# Patient Record
Sex: Female | Born: 1965 | Race: White | Hispanic: No | Marital: Married | Smoking: Current every day smoker
Health system: Southern US, Community
[De-identification: ages and names within clinical notes are randomized; demographics above are authoritative.]

## PROBLEM LIST (undated history)

## (undated) DIAGNOSIS — Z860100 Personal history of colon polyps, unspecified: Secondary | ICD-10-CM

## (undated) DIAGNOSIS — J449 Chronic obstructive pulmonary disease, unspecified: Secondary | ICD-10-CM

## (undated) DIAGNOSIS — F32A Depression, unspecified: Secondary | ICD-10-CM

## (undated) DIAGNOSIS — F329 Major depressive disorder, single episode, unspecified: Secondary | ICD-10-CM

## (undated) DIAGNOSIS — K5090 Crohn's disease, unspecified, without complications: Secondary | ICD-10-CM

## (undated) DIAGNOSIS — J45909 Unspecified asthma, uncomplicated: Secondary | ICD-10-CM

## (undated) HISTORY — PX: CHOLECYSTECTOMY: SHX55

## (undated) HISTORY — PX: EYE SURGERY: SHX253

## (undated) HISTORY — PX: ABDOMINAL HYSTERECTOMY: SHX81

---

## 2005-01-10 ENCOUNTER — Ambulatory Visit: Payer: Self-pay

## 2005-01-17 ENCOUNTER — Ambulatory Visit: Payer: Self-pay

## 2005-01-24 ENCOUNTER — Ambulatory Visit: Payer: Self-pay

## 2005-02-06 ENCOUNTER — Ambulatory Visit: Payer: Self-pay

## 2005-03-07 ENCOUNTER — Ambulatory Visit: Payer: Self-pay

## 2006-04-06 ENCOUNTER — Emergency Department: Payer: Self-pay | Admitting: Internal Medicine

## 2006-11-26 ENCOUNTER — Emergency Department: Payer: Self-pay

## 2006-11-27 ENCOUNTER — Emergency Department: Payer: Self-pay | Admitting: Emergency Medicine

## 2007-10-23 ENCOUNTER — Emergency Department: Payer: Self-pay | Admitting: Internal Medicine

## 2008-02-17 ENCOUNTER — Emergency Department: Payer: Self-pay | Admitting: Emergency Medicine

## 2008-02-17 ENCOUNTER — Other Ambulatory Visit: Payer: Self-pay

## 2008-10-11 ENCOUNTER — Emergency Department: Payer: Self-pay | Admitting: Unknown Physician Specialty

## 2008-10-25 ENCOUNTER — Emergency Department: Payer: Self-pay | Admitting: Emergency Medicine

## 2009-07-13 ENCOUNTER — Ambulatory Visit: Payer: Self-pay | Admitting: Gastroenterology

## 2009-10-27 ENCOUNTER — Ambulatory Visit: Payer: Self-pay | Admitting: Gastroenterology

## 2009-11-23 ENCOUNTER — Ambulatory Visit: Payer: Self-pay | Admitting: Family Medicine

## 2010-03-06 ENCOUNTER — Ambulatory Visit: Payer: Self-pay | Admitting: Gastroenterology

## 2011-07-09 ENCOUNTER — Emergency Department: Payer: Self-pay | Admitting: Emergency Medicine

## 2011-08-03 ENCOUNTER — Ambulatory Visit: Payer: Self-pay | Admitting: Emergency Medicine

## 2011-08-09 ENCOUNTER — Ambulatory Visit: Payer: Self-pay | Admitting: Emergency Medicine

## 2011-08-13 LAB — PATHOLOGY REPORT

## 2012-02-27 ENCOUNTER — Ambulatory Visit: Payer: Self-pay | Admitting: Family Medicine

## 2012-03-04 ENCOUNTER — Emergency Department: Payer: Self-pay | Admitting: Emergency Medicine

## 2012-03-04 LAB — COMPREHENSIVE METABOLIC PANEL
Albumin: 3.2 g/dL — ABNORMAL LOW (ref 3.4–5.0)
Anion Gap: 6 — ABNORMAL LOW (ref 7–16)
BUN: 8 mg/dL (ref 7–18)
Bilirubin,Total: 0.3 mg/dL (ref 0.2–1.0)
Calcium, Total: 8.8 mg/dL (ref 8.5–10.1)
Chloride: 106 mmol/L (ref 98–107)
Co2: 26 mmol/L (ref 21–32)
Glucose: 100 mg/dL — ABNORMAL HIGH (ref 65–99)
SGOT(AST): 16 U/L (ref 15–37)
SGPT (ALT): 14 U/L
Sodium: 138 mmol/L (ref 136–145)
Total Protein: 7.3 g/dL (ref 6.4–8.2)

## 2012-03-04 LAB — URINALYSIS, COMPLETE
Glucose,UR: NEGATIVE mg/dL (ref 0–75)
Ketone: NEGATIVE
Ph: 5 (ref 4.5–8.0)
Protein: 30

## 2012-03-04 LAB — CBC
HCT: 40.3 % (ref 35.0–47.0)
MCV: 77 fL — ABNORMAL LOW (ref 80–100)
RDW: 15.5 % — ABNORMAL HIGH (ref 11.5–14.5)

## 2012-05-15 ENCOUNTER — Ambulatory Visit: Payer: Self-pay | Admitting: Obstetrics and Gynecology

## 2012-05-15 LAB — CBC
HGB: 11.5 g/dL — ABNORMAL LOW (ref 12.0–16.0)
MCHC: 31.5 g/dL — ABNORMAL LOW (ref 32.0–36.0)
MCV: 76 fL — ABNORMAL LOW (ref 80–100)
RBC: 4.81 10*6/uL (ref 3.80–5.20)
RDW: 16.7 % — ABNORMAL HIGH (ref 11.5–14.5)
WBC: 9.3 10*3/uL (ref 3.6–11.0)

## 2012-05-20 ENCOUNTER — Ambulatory Visit: Payer: Self-pay | Admitting: Obstetrics and Gynecology

## 2012-05-21 LAB — HEMOGLOBIN: HGB: 8.7 g/dL — ABNORMAL LOW (ref 12.0–16.0)

## 2012-05-26 LAB — PATHOLOGY REPORT

## 2013-08-16 ENCOUNTER — Inpatient Hospital Stay: Payer: Self-pay | Admitting: Student

## 2013-08-16 LAB — COMPREHENSIVE METABOLIC PANEL
Alkaline Phosphatase: 115 U/L (ref 50–136)
Anion Gap: 5 — ABNORMAL LOW (ref 7–16)
BUN: 8 mg/dL (ref 7–18)
Co2: 25 mmol/L (ref 21–32)
Creatinine: 0.76 mg/dL (ref 0.60–1.30)
EGFR (African American): >60
EGFR (Non-African Amer.): >60
Glucose: 104 mg/dL — ABNORMAL HIGH (ref 65–99)
Osmolality: 272 (ref 275–301)
Potassium: 4.1 mmol/L (ref 3.5–5.1)
SGPT (ALT): 18 U/L (ref 12–78)
Sodium: 137 mmol/L (ref 136–145)

## 2013-08-16 LAB — URINALYSIS, COMPLETE
Glucose,UR: NEGATIVE mg/dL (ref 0–75)
Hyaline Cast: 5
Nitrite: NEGATIVE
Squamous Epithelial: 8
WBC UR: 2 /HPF (ref 0–5)

## 2013-08-16 LAB — SEDIMENTATION RATE: Erythrocyte Sed Rate: 2 mm/hr (ref 0–20)

## 2013-08-16 LAB — CBC
MCHC: 33.7 g/dL (ref 32.0–36.0)
MCV: 88 fL (ref 80–100)
WBC: 17.4 10*3/uL — ABNORMAL HIGH (ref 3.6–11.0)

## 2013-08-16 LAB — LIPASE, BLOOD: Lipase: 95 U/L (ref 73–393)

## 2013-08-17 LAB — CBC WITH DIFFERENTIAL/PLATELET
HCT: 40.4 % (ref 35.0–47.0)
HGB: 13.6 g/dL (ref 12.0–16.0)
Lymphocyte #: 0.8 10*3/uL — ABNORMAL LOW (ref 1.0–3.6)
Lymphocyte %: 6.1 %
Monocyte #: 0.2 x10 3/mm (ref 0.2–0.9)
Monocyte %: 1.6 %
Neutrophil #: 11.7 10*3/uL — ABNORMAL HIGH (ref 1.4–6.5)
Neutrophil %: 92.2 %
RBC: 4.64 10*6/uL (ref 3.80–5.20)
WBC: 12.7 10*3/uL — ABNORMAL HIGH (ref 3.6–11.0)

## 2013-08-17 LAB — BASIC METABOLIC PANEL
BUN: 7 mg/dL (ref 7–18)
Chloride: 107 mmol/L (ref 98–107)
Creatinine: 0.61 mg/dL (ref 0.60–1.30)
EGFR (African American): >60
EGFR (Non-African Amer.): >60
Glucose: 124 mg/dL — ABNORMAL HIGH (ref 65–99)
Osmolality: 277 (ref 275–301)
Potassium: 3.8 mmol/L (ref 3.5–5.1)
Sodium: 139 mmol/L (ref 136–145)

## 2013-08-18 LAB — CBC WITH DIFFERENTIAL/PLATELET
Eosinophil #: 0 10*3/uL (ref 0.0–0.7)
Eosinophil %: 0 %
HCT: 38.1 % (ref 35.0–47.0)
HGB: 12.9 g/dL (ref 12.0–16.0)
Lymphocyte %: 6.2 %
MCH: 29.7 pg (ref 26.0–34.0)
MCHC: 33.7 g/dL (ref 32.0–36.0)
Monocyte #: 0.5 x10 3/mm (ref 0.2–0.9)
Neutrophil #: 12.6 10*3/uL — ABNORMAL HIGH (ref 1.4–6.5)
RBC: 4.33 10*6/uL (ref 3.80–5.20)
RDW: 13.3 % (ref 11.5–14.5)
WBC: 14 10*3/uL — ABNORMAL HIGH (ref 3.6–11.0)

## 2013-12-14 ENCOUNTER — Ambulatory Visit: Payer: Self-pay | Admitting: Gastroenterology

## 2014-01-06 ENCOUNTER — Other Ambulatory Visit: Payer: Self-pay | Admitting: Urgent Care

## 2014-01-06 LAB — COMPREHENSIVE METABOLIC PANEL
ALT: 45 U/L (ref 12–78)
AST: 32 U/L (ref 15–37)
Albumin: 3.3 g/dL — ABNORMAL LOW (ref 3.4–5.0)
Alkaline Phosphatase: 115 U/L
Anion Gap: 3 — ABNORMAL LOW (ref 7–16)
BUN: 8 mg/dL (ref 7–18)
Bilirubin,Total: 0.3 mg/dL (ref 0.2–1.0)
CALCIUM: 9.2 mg/dL (ref 8.5–10.1)
Chloride: 105 mmol/L (ref 98–107)
Co2: 29 mmol/L (ref 21–32)
Creatinine: 0.62 mg/dL (ref 0.60–1.30)
EGFR (African American): >60
EGFR (Non-African Amer.): >60
GLUCOSE: 74 mg/dL (ref 65–99)
Osmolality: 271 (ref 275–301)
Potassium: 3.9 mmol/L (ref 3.5–5.1)
SODIUM: 137 mmol/L (ref 136–145)
Total Protein: 7.2 g/dL (ref 6.4–8.2)

## 2014-01-06 LAB — CBC WITH DIFFERENTIAL/PLATELET
BASOS ABS: 0.1 10*3/uL (ref 0.0–0.1)
BASOS PCT: 0.7 %
Eosinophil #: 0.3 10*3/uL (ref 0.0–0.7)
Eosinophil %: 3.3 %
HCT: 45.9 % (ref 35.0–47.0)
HGB: 15.6 g/dL (ref 12.0–16.0)
LYMPHS ABS: 2 10*3/uL (ref 1.0–3.6)
Lymphocyte %: 19.4 %
MCH: 30.4 pg (ref 26.0–34.0)
MCHC: 34 g/dL (ref 32.0–36.0)
MCV: 90 fL (ref 80–100)
MONO ABS: 0.9 x10 3/mm (ref 0.2–0.9)
Monocyte %: 8.6 %
Neutrophil #: 7 10*3/uL — ABNORMAL HIGH (ref 1.4–6.5)
Neutrophil %: 68 %
Platelet: 304 10*3/uL (ref 150–440)
RBC: 5.13 10*6/uL (ref 3.80–5.20)
RDW: 13.7 % (ref 11.5–14.5)
WBC: 10.2 10*3/uL (ref 3.6–11.0)

## 2014-03-30 ENCOUNTER — Other Ambulatory Visit: Payer: Self-pay | Admitting: Urgent Care

## 2014-03-30 LAB — URINALYSIS, COMPLETE
Bacteria: NONE SEEN
Glucose,UR: NEGATIVE mg/dL (ref 0–75)
Leukocyte Esterase: NEGATIVE
NITRITE: NEGATIVE
PH: 5 (ref 4.5–8.0)
RBC,UR: <1 /HPF (ref 0–5)
Specific Gravity: 1.044 (ref 1.003–1.030)
WBC UR: <1 /HPF (ref 0–5)

## 2014-03-30 LAB — CBC WITH DIFFERENTIAL/PLATELET
BASOS PCT: 0.8 %
Basophil #: 0.1 10*3/uL (ref 0.0–0.1)
Eosinophil #: 0.1 10*3/uL (ref 0.0–0.7)
Eosinophil %: 0.9 %
HCT: 50.2 % — ABNORMAL HIGH (ref 35.0–47.0)
HGB: 16.4 g/dL — AB (ref 12.0–16.0)
Lymphocyte #: 2.8 10*3/uL (ref 1.0–3.6)
Lymphocyte %: 19.4 %
MCH: 29.2 pg (ref 26.0–34.0)
MCHC: 32.6 g/dL (ref 32.0–36.0)
MCV: 90 fL (ref 80–100)
Monocyte #: 1.2 x10 3/mm — ABNORMAL HIGH (ref 0.2–0.9)
Monocyte %: 8.4 %
NEUTROS ABS: 10.3 10*3/uL — AB (ref 1.4–6.5)
Neutrophil %: 70.5 %
PLATELETS: 292 10*3/uL (ref 150–440)
RBC: 5.61 10*6/uL — ABNORMAL HIGH (ref 3.80–5.20)
RDW: 13.6 % (ref 11.5–14.5)
WBC: 14.6 10*3/uL — AB (ref 3.6–11.0)

## 2014-03-30 LAB — COMPREHENSIVE METABOLIC PANEL
ALK PHOS: 88 U/L
ALT: 23 U/L (ref 12–78)
Albumin: 3.7 g/dL (ref 3.4–5.0)
Anion Gap: 6 — ABNORMAL LOW (ref 7–16)
BILIRUBIN TOTAL: 0.4 mg/dL (ref 0.2–1.0)
BUN: 13 mg/dL (ref 7–18)
CHLORIDE: 106 mmol/L (ref 98–107)
CO2: 25 mmol/L (ref 21–32)
Calcium, Total: 9.1 mg/dL (ref 8.5–10.1)
Creatinine: 0.93 mg/dL (ref 0.60–1.30)
GLUCOSE: 111 mg/dL — AB (ref 65–99)
OSMOLALITY: 275 (ref 275–301)
POTASSIUM: 3.9 mmol/L (ref 3.5–5.1)
SGOT(AST): 26 U/L (ref 15–37)
Sodium: 137 mmol/L (ref 136–145)
Total Protein: 7.3 g/dL (ref 6.4–8.2)

## 2014-03-30 LAB — SEDIMENTATION RATE: Erythrocyte Sed Rate: 45 mm/hr — ABNORMAL HIGH (ref 0–20)

## 2014-05-11 ENCOUNTER — Other Ambulatory Visit: Payer: Self-pay | Admitting: Urgent Care

## 2014-05-11 LAB — CBC WITH DIFFERENTIAL/PLATELET
BASOS ABS: 0.1 10*3/uL (ref 0.0–0.1)
Basophil %: 0.8 %
EOS PCT: 2.2 %
Eosinophil #: 0.3 10*3/uL (ref 0.0–0.7)
HCT: 45.7 % (ref 35.0–47.0)
HGB: 14.9 g/dL (ref 12.0–16.0)
LYMPHS ABS: 2.1 10*3/uL (ref 1.0–3.6)
Lymphocyte %: 15.7 %
MCH: 29.4 pg (ref 26.0–34.0)
MCHC: 32.6 g/dL (ref 32.0–36.0)
MCV: 90 fL (ref 80–100)
Monocyte #: 1.3 x10 3/mm — ABNORMAL HIGH (ref 0.2–0.9)
Monocyte %: 9.3 %
Neutrophil #: 9.7 10*3/uL — ABNORMAL HIGH (ref 1.4–6.5)
Neutrophil %: 72 %
PLATELETS: 312 10*3/uL (ref 150–440)
RBC: 5.07 10*6/uL (ref 3.80–5.20)
RDW: 13.9 % (ref 11.5–14.5)
WBC: 13.5 10*3/uL — AB (ref 3.6–11.0)

## 2014-08-30 ENCOUNTER — Ambulatory Visit: Payer: Self-pay | Admitting: Urgent Care

## 2014-08-30 LAB — CBC WITH DIFFERENTIAL/PLATELET
Basophil #: 0.1 10*3/uL (ref 0.0–0.1)
Basophil %: 0.5 %
EOS PCT: 3.3 %
Eosinophil #: 0.4 10*3/uL (ref 0.0–0.7)
HCT: 42.6 % (ref 35.0–47.0)
HGB: 13.7 g/dL (ref 12.0–16.0)
LYMPHS PCT: 28.1 %
Lymphocyte #: 3 10*3/uL (ref 1.0–3.6)
MCH: 29.2 pg (ref 26.0–34.0)
MCHC: 32.2 g/dL (ref 32.0–36.0)
MCV: 91 fL (ref 80–100)
Monocyte #: 0.7 x10 3/mm (ref 0.2–0.9)
Monocyte %: 6.6 %
Neutrophil #: 6.6 10*3/uL — ABNORMAL HIGH (ref 1.4–6.5)
Neutrophil %: 61.5 %
Platelet: 287 10*3/uL (ref 150–440)
RBC: 4.68 10*6/uL (ref 3.80–5.20)
RDW: 13 % (ref 11.5–14.5)
WBC: 10.7 10*3/uL (ref 3.6–11.0)

## 2014-08-30 LAB — COMPREHENSIVE METABOLIC PANEL
ALBUMIN: 3.5 g/dL (ref 3.4–5.0)
ALK PHOS: 81 U/L
AST: 18 U/L (ref 15–37)
Anion Gap: 4 — ABNORMAL LOW (ref 7–16)
BUN: 8 mg/dL (ref 7–18)
Bilirubin,Total: 0.2 mg/dL (ref 0.2–1.0)
CALCIUM: 8.8 mg/dL (ref 8.5–10.1)
CHLORIDE: 108 mmol/L — AB (ref 98–107)
Co2: 31 mmol/L (ref 21–32)
Creatinine: 0.72 mg/dL (ref 0.60–1.30)
EGFR (African American): >60
EGFR (Non-African Amer.): >60
Glucose: 105 mg/dL — ABNORMAL HIGH (ref 65–99)
Osmolality: 284 (ref 275–301)
Potassium: 3.6 mmol/L (ref 3.5–5.1)
SGPT (ALT): 24 U/L
Sodium: 143 mmol/L (ref 136–145)
Total Protein: 6.5 g/dL (ref 6.4–8.2)

## 2015-02-09 ENCOUNTER — Emergency Department: Admit: 2015-02-09 | Discharge status: Against Medical Advice | Payer: Self-pay | Admitting: Student

## 2015-02-09 LAB — CBC WITH DIFFERENTIAL/PLATELET
BASOS PCT: 0.5 %
Basophil #: 0.1 10*3/uL (ref 0.0–0.1)
EOS PCT: 1 %
Eosinophil #: 0.2 10*3/uL (ref 0.0–0.7)
HCT: 52.6 % — ABNORMAL HIGH (ref 35.0–47.0)
HGB: 17.4 g/dL — AB (ref 12.0–16.0)
Lymphocyte #: 2.3 10*3/uL (ref 1.0–3.6)
Lymphocyte %: 11.5 %
MCH: 29.6 pg (ref 26.0–34.0)
MCHC: 33.1 g/dL (ref 32.0–36.0)
MCV: 90 fL (ref 80–100)
Monocyte #: 0.9 x10 3/mm (ref 0.2–0.9)
Monocyte %: 4.6 %
NEUTROS PCT: 82.4 %
Neutrophil #: 16.5 10*3/uL — ABNORMAL HIGH (ref 1.4–6.5)
PLATELETS: 334 10*3/uL (ref 150–440)
RBC: 5.88 10*6/uL — AB (ref 3.80–5.20)
RDW: 13.4 % (ref 11.5–14.5)
WBC: 20 10*3/uL — ABNORMAL HIGH (ref 3.6–11.0)

## 2015-02-09 LAB — COMPREHENSIVE METABOLIC PANEL
Albumin: 4.6 g/dL
Alkaline Phosphatase: 92 U/L
Anion Gap: 12 (ref 7–16)
BUN: 14 mg/dL
Bilirubin,Total: 0.3 mg/dL
CALCIUM: 10.2 mg/dL
Chloride: 103 mmol/L
Co2: 23 mmol/L
Creatinine: 0.75 mg/dL
Glucose: 150 mg/dL — ABNORMAL HIGH
Potassium: 4 mmol/L
SGOT(AST): 18 U/L
SGPT (ALT): 17 U/L
Sodium: 138 mmol/L
TOTAL PROTEIN: 8.2 g/dL — AB

## 2015-02-09 LAB — TROPONIN I: Troponin-I: <0.03 ng/mL

## 2015-02-09 LAB — LIPASE, BLOOD: LIPASE: 56 U/L — AB

## 2015-02-16 ENCOUNTER — Inpatient Hospital Stay
Admit: 2015-02-16 | Disposition: A | Discharge status: Home / Self Care | Payer: Self-pay | Attending: Internal Medicine | Admitting: Internal Medicine

## 2015-02-16 LAB — URINALYSIS, COMPLETE
Bilirubin,UR: NEGATIVE
Glucose,UR: NEGATIVE mg/dL (ref 0–75)
KETONE: NEGATIVE
Leukocyte Esterase: NEGATIVE
Nitrite: NEGATIVE
PH: 6 (ref 4.5–8.0)
Protein: 30
SPECIFIC GRAVITY: 1.008 (ref 1.003–1.030)

## 2015-02-16 LAB — CBC WITH DIFFERENTIAL/PLATELET
BASOS ABS: 0.1 10*3/uL (ref 0.0–0.1)
Basophil %: 0.6 %
EOS ABS: 0.1 10*3/uL (ref 0.0–0.7)
Eosinophil %: 0.7 %
HCT: 57.2 % — ABNORMAL HIGH (ref 35.0–47.0)
HGB: 18.7 g/dL — AB (ref 12.0–16.0)
Lymphocyte #: 2.7 10*3/uL (ref 1.0–3.6)
Lymphocyte %: 15.9 %
MCH: 29.3 pg (ref 26.0–34.0)
MCHC: 32.7 g/dL (ref 32.0–36.0)
MCV: 90 fL (ref 80–100)
Monocyte #: 0.9 x10 3/mm (ref 0.2–0.9)
Monocyte %: 5.5 %
NEUTROS PCT: 77.3 %
Neutrophil #: 13.1 10*3/uL — ABNORMAL HIGH (ref 1.4–6.5)
Platelet: 367 10*3/uL (ref 150–440)
RBC: 6.38 10*6/uL — ABNORMAL HIGH (ref 3.80–5.20)
RDW: 13.5 % (ref 11.5–14.5)
WBC: 16.9 10*3/uL — ABNORMAL HIGH (ref 3.6–11.0)

## 2015-02-16 LAB — COMPREHENSIVE METABOLIC PANEL
ALBUMIN: 4.6 g/dL
ALK PHOS: 92 U/L
Anion Gap: 9 (ref 7–16)
BUN: 13 mg/dL
Bilirubin,Total: 0.4 mg/dL
CALCIUM: 10.3 mg/dL
CHLORIDE: 104 mmol/L
Co2: 26 mmol/L
Creatinine: 0.75 mg/dL
EGFR (Non-African Amer.): >60
GLUCOSE: 131 mg/dL — AB
Potassium: 4.1 mmol/L
SGOT(AST): 20 U/L
SGPT (ALT): 16 U/L
Sodium: 139 mmol/L
Total Protein: 8.2 g/dL — ABNORMAL HIGH

## 2015-02-16 LAB — TROPONIN I: Troponin-I: <0.03 ng/mL

## 2015-02-16 LAB — LIPASE, BLOOD: Lipase: 63 U/L — ABNORMAL HIGH

## 2015-02-16 LAB — LACTIC ACID, PLASMA: Lactic Acid, Venous: 0.8 mmol/L

## 2015-02-17 LAB — BASIC METABOLIC PANEL
Anion Gap: 5 — ABNORMAL LOW (ref 7–16)
BUN: 11 mg/dL
CHLORIDE: 109 mmol/L
CO2: 26 mmol/L
Calcium, Total: 8.2 mg/dL — ABNORMAL LOW
Creatinine: 0.54 mg/dL
EGFR (African American): >60
Glucose: 122 mg/dL — ABNORMAL HIGH
POTASSIUM: 4 mmol/L
Sodium: 140 mmol/L

## 2015-02-17 LAB — CBC WITH DIFFERENTIAL/PLATELET
Basophil #: 0 10*3/uL (ref 0.0–0.1)
Basophil %: 0.2 %
Eosinophil #: 0 10*3/uL (ref 0.0–0.7)
Eosinophil %: 0 %
HCT: 40.4 % (ref 35.0–47.0)
HGB: 13.3 g/dL (ref 12.0–16.0)
Lymphocyte #: 1.1 10*3/uL (ref 1.0–3.6)
Lymphocyte %: 12.2 %
MCH: 29.7 pg (ref 26.0–34.0)
MCHC: 33 g/dL (ref 32.0–36.0)
MCV: 90 fL (ref 80–100)
MONOS PCT: 2.6 %
Monocyte #: 0.2 x10 3/mm (ref 0.2–0.9)
Neutrophil #: 7.7 10*3/uL — ABNORMAL HIGH (ref 1.4–6.5)
Neutrophil %: 85 %
PLATELETS: 246 10*3/uL (ref 150–440)
RBC: 4.49 10*6/uL (ref 3.80–5.20)
RDW: 13 % (ref 11.5–14.5)
WBC: 9 10*3/uL (ref 3.6–11.0)

## 2015-02-18 LAB — BASIC METABOLIC PANEL
Anion Gap: 3 — ABNORMAL LOW (ref 7–16)
BUN: 9 mg/dL
CHLORIDE: 111 mmol/L
CO2: 27 mmol/L
CREATININE: 0.51 mg/dL
Calcium, Total: 8.8 mg/dL — ABNORMAL LOW
EGFR (African American): >60
EGFR (Non-African Amer.): >60
Glucose: 126 mg/dL — ABNORMAL HIGH
Potassium: 3.7 mmol/L
Sodium: 141 mmol/L

## 2015-02-22 NOTE — Op Note (Signed)
PATIENT NAME:  [name], MIRABAL MR#:  [id number] DATE OF BIRTH:  [birth year]  DATE OF PROCEDURE:  05/20/2012  PREOPERATIVE DIAGNOSIS:  1. Leiomyoma of the uterus. 2. Menorrhagia.   POSTOPERATIVE DIAGNOSIS:  1. Leiomyoma of the uterus.  2. Menorrhagia.   OPERATION PERFORMED:  Complete laparoscopic hysterectomy.   SURGEON: Stoney Bang. Georgianne Fick, MD   ASSISTANT: Donzetta Matters, MD  ANESTHESIA: General.   ESTIMATED BLOOD LOSS: 350 mL.  OPERATIVE FLUIDS: 1600 mL of crystalloid.   URINE OUTPUT: 250 mL.   COMPLICATIONS: None.   INTRAOPERATIVE FINDINGS: Large fibroid uterus with one dominant fundal fibroid and a second large right lateral fibroid, evidence of prior BTL. Tubes are grossly normal. Some adhesion of the small bowel to the anterior abdominal wall in the area of the cecum. Cystoscopy revealed efflux of dye from bilateral ureters at the conclusion of the case.   SPECIMENS REMOVED: Uterus, cervix, right and left fallopian tube.   CONDITION FOLLOWING PROCEDURE: Stable.   PROCEDURE IN DETAIL: The risks, benefits, and alternatives to the procedure were discussed with the patient prior to proceeding to the Operating Room. The patient was taken to the Operating Room and placed under general endotracheal anesthesia and positioned in the dorsal lithotomy position. She was prepped and draped in the usual sterile fashion. A timeout was performed. A Foley catheter was placed in the patient's bladder, and a sterile speculum was used to visualize the cervix. The anterior lip of the cervix was grasped with a single-tooth tenaculum. The uterus sounded to 8 cm, and a large VCare uterine manipulator was then placed. The tenaculum and operative speculum were then removed. Attention was turned to the patient's abdomen. The umbilicus was infiltrated with 1% lidocaine, and a stab incision was made at the base of the umbilicus. A 5 mm XL port was then placed into the umbilical incision and  intraperitoneal entry was gained under direct visualization. Once entry into the peritoneal cavity had been established, pneumoperitoneum was begun. After insufflation was complete, two assistant ports were placed under direct visualization, one 5 mm left lateral and one 29m right lateral. Survey of the abdomen noted the above findings. Attention was turned to be adhesion of small bowel in the area of cecum in the anterior abdominal wall. This was taken down bluntly. Following removal of these adhesions, the patient's ovaries were inspected and noted to be normal in appearance. The right tube was grasped and dissected off the ovary and mesosalpinx using the 5 mm Harmonic device. The utero-ovarian and round ligaments were then cauterized using bipolar energy and transected using a 5 mm LigaSure device. The anterior leaf of the broad ligament was then dissected down the level of the internal cervical os, and a bladder flap was created. This was then repeated on the patient's left. Of note, after the bladder flap had been created, there was some bleeding from what appeared to be the left uterine artery. This was then cauterized using bipolar energy, transected using the 5 mm Harmonic. Attention was then turned to the patient's right uterine artery which was also cauterized using bipolar energy transected using 5 mm LigaSure device. After transection of the uterine artery, an anterior colpotomy was made using the VCare cuff as a guide. The colpotomy was then continued around the cervix staying within the cup [facility] the [facility]. After the specimen was amputated, attempt was made to deliver the specimen; however, the VCare device detached. As a result, the specimen was grasped under direct visualization using  two single-tooth tenacula and was able to be delivered without difficulty. Following delivery of the specimen, the vaginal cuff was closed vaginally using interrupted stitches of 0 Vicryl and figure-of-eight  sutures. After closure of the vaginal cuff, bimanual exam was performed noting no defects in the vaginal cuff. The laparoscope was then replaced, and  the vaginal cuff pedicles were inspected and noted to be hemostatic. After verifying hemostasis of the pedicles, the 5 mm trocar incisions were dressed with Dermabond, and the fascia of the 10 mm trocar incision was closed using a 2-0 Vicryl on a GU needle. The incision was then also dressed with Dermabond. Cystoscopy was performed noting no defect in the bladder and bilateral efflux of methylene blue from both ureters. Sponge, needle, and instrument counts were correct x2.   The patient tolerated the procedure well and was taken to the recovery room in stable condition.   ____________________________ Stoney Bang. Georgianne Fick, MD ams:cbb D: 05/20/2012 16:51:45 ET T: 05/20/2012 17:44:55 ET JOB#: [id number]  cc: Stoney Bang. Georgianne Fick, MD, <Dictator> Conan Bowens Madelon Lips MD ELECTRONICALLY SIGNED 05/21/2012 7:34

## 2015-02-25 NOTE — Discharge Summary (Signed)
PATIENT NAME:  [name], [name] MR#:  [id number] DATE OF BIRTH:  [birth year]  DATE OF ADMISSION:  08/16/2013 DATE OF DISCHARGE:  08/19/2013  CONSULTANT:  Dr. Candace Cruise from GI.   PRIMARY CARE PHYSICIAN:  Dr. Brunetta Genera.   CHIEF COMPLAINT:  Abdominal pain, nausea, vomiting.   DISCHARGE DIAGNOSES: 1.  Acute Crohn flare.  2.  Systemic inflammatory response syndrome, resolved, likely secondary to Crohn flare.  3.  Tobacco abuse.  4.  History of chronic obstructive pulmonary disease, not on any inhalers.  5.  History of anxiety.   DISCHARGE MEDICATIONS:  Prednisone 30 mg daily until seen by GI in 2 weeks, Ventolin HFA CFC-free 90 mcg per inhaled aerosol 2 puffs 4 times a day as needed for wheezing, mesalamine 500 mg extended-release 2 caps 4 times a day.   DIET: GI soft diet.   ACTIVITY: As tolerated.   Please follow with PCP within 1 to 2 weeks. Please follow with GI in 2 weeks.   DISPOSITION: Home.   SIGNIFICANT LABS AND IMAGING: Initial BUN 8, creatinine 0.76. LFTs within normal limits. Initial WBC 17.4. Last WBC of 14. ESR of 2. Hemoglobin 16.6 on arrival. Platelet count of 376. UA did not suggest infection. CRP was a little elevated at 17. CT abdomen and pelvis with contrast shows multiple dilation of small bowel measuring up to 3.2 cm with multiple air-fluid levels consistent with small bowel obstruction with transition zone at the distal ileum consistent with enteritis.   HISTORY OF PRESENT ILLNESS AND HOSPITAL COURSE: For full details of the H and P, please see the dictation on October 12th by Dr. Verdell Carmine, but briefly this is a pleasant 49 year old female with abdominal pain, nausea, vomiting the day prior to admission with some distention, came into the hospital. She has a history of Crohn's and previously was on some medications for it, but has not seen GI in over a year. On admission, the patient did have SIRS criteria, and also CT shows Crohn's. She was admitted to the hospitalist service  with IV fluids, IV steroids and also Cipro and Flagyl. She was seen by Dr. Candace Cruise. ESR was not significantly elevated. CRP was mildly elevated. She was continued on the above treatment. She was seen by Dr. Candace Cruise. She had gradual improvement in her symptoms, and by the day of discharge, her abdominal pain had resolved. She stated that she has chronic loose stools. She, of note, had not been on any medications for her Crohn's, and she would be followed up by GI as an outpatient. She will be on mesalamine q.i.d., in addition to prednisone 30 mg daily for a couple of weeks until seen by GI, who would then taper this, and also do the paperwork for mesalamine as she cannot afford it, she states. Her systemic inflammatory response syndrome has resolved. On the day of discharge, her temperature was 98.1, pulse rate 74, respiratory rate 18, blood pressure 111/76, O2 sat 97% on room air. Generally, the patient is an obese female, lying in bed, no obvious distress. Normocephalic, atraumatic. Lungs are clear to auscultation. Normal S1, S2 without murmurs. Abdomen exhibits hyperactive bowel sounds, but no significant tenderness. No guarding. No lower extremity edema.   She will be discharged with outpatient followup.   TOTAL TIME SPENT: 33 minutes.   ____________________________ Vivien Presto, MD sa:dmm D: 08/20/2013 12:41:50 ET T: 08/20/2013 12:51:28 ET JOB#: [id number]  cc: Vivien Presto, MD, <Dictator> Vivien Presto MD ELECTRONICALLY SIGNED 09/04/2013 14:51

## 2015-02-25 NOTE — Consult Note (Signed)
PATIENT NAME:  [name], [name] MR#:  [id number] DATE OF BIRTH:  [birth year]  DATE OF CONSULTATION:  08/17/2013  CONSULTING PHYSICIAN:  Lupita Dawn. Melynda Krzywicki, MD  REASON FOR REFERRAL: Abdominal pain, nausea, vomiting, and diarrhea.   [name] is a 49 year old white female with known history of Crohn's disease that was diagnosed 3 to 4 years ago. She has been followed by Dr. Dionne Milo in the recent past. She was on Apriso 4 tablets daily for about a year or year and a half until she lost her Medicaid. Therefore, she stopped taking Apriso altogether. This has been at least a year now. She has chronic diarrhea where she would have multiple loose stools per day, with some urgency. There is no significant abdominal pain or cramping. There is no bleeding per rectum. She has some mucous discharge per rectum instead.   The patient was doing well when she developed acute abdominal pain yesterday morning followed by bouts of nausea, vomiting, and abdominal pain. The patient presented to the Emergency Room where CT scan was done and it shows some increased inflammation in the iliac area consistent with Crohn's disease.   After discussing with the admitting doctor , I had recommended she start IV steroids overnight. Today she is feeling much better. Yesterday, she was n.p.o. She was started on a clear liquid diet. She is able to tolerate diet without much difficulty.   There is a history of Crohn's disease.   PAST SURGICAL HISTORY: Hysterectomy and cholecystectomy.   SOCIAL HISTORY: She smokes but does not drink alcohol.   FAMILY HISTORY: Notable for a cousin with inflammatory bowel disease.   REVIEW OF SYSTEMS: There is no fevers or chills or weight changes. There is no visual or hearing changes. There is no coughing or shortness of breath. There is no chest pain or palpitation. GI symptoms are notable for nausea, vomiting, and abdominal pain and diarrhea.   The rest of the review of symptoms is negative.    PHYSICAL EXAMINATION: GENERAL: Currently she is in no acute distress.  VITAL SIGNS: She is afebrile. Vital signs are stable.  HEAD: Normocephalic, atraumatic. HEENT:  Pupils are equally reactive. Throat was clear.  NECK: Supple.  CARDIAC: Regular rhythm and rate without murmurs.  LUNGS: Clear bilaterally.  ABDOMEN: Showed, right now there is no tenderness although she was described as having some left upper quadrant pain yesterday.  I do not really appreciate tenderness right now. She has active bowel sounds. There is no hepatomegaly.  EXTREMITIES: No clubbing, cyanosis, or edema.  NEUROLOGIC: Negative.  SKIN: Examination is normal.   LABS/STUDIES: Results showed normal electrolytes. Liver enzymes are normal. White count 17.4 yesterday, is down to 12.7. Sed rate is 2.  CRP level is pending. Again, CT scan showed some dilated small bowel with some partial small bowel obstruction with some transition zone in the distal ileum with some inflammatory changes around it.   IMPRESSION: This is a patient with Crohn's flare involving the ileum. The patient is doing better even on IV hydration and IV steroids. We will  continue IV steroids for another day or two. If she continues to improve, she can be switched to oral steroids with gradual taper over the next week or two. Once the steroids taper, she will need to be on either back on Apriso or  a similar mesalamine whichever is cheaper for her to afford. Since I would be out at [facility]  tomorrow, I will not be able to see  the patient tomorrwo. However, she certainly will need to follow-up in GI after discharge if she improves quickly. Advance the diet as tolerated to low-residue diet. Thank you for the referral.   ____________________________ Lupita Dawn. Candace Cruise, MD pyo:sg D: 08/17/2013 13:50:42 ET T: 08/17/2013 14:01:31 ET JOB#: [id number]  cc: Lupita Dawn. Candace Cruise, MD, <Dictator> Lupita Dawn. Candace Cruise, MD  Hulen Luster MD ELECTRONICALLY SIGNED 08/20/2013 10:29

## 2015-02-25 NOTE — Consult Note (Signed)
Chief Complaint:  Subjective/Chief Complaint Pt denies any abdominal pain, nausea or vomiting.  She did have heartburn.  She has had several soft liquid bowel movements today without bleeding.  States she feels at her baseline. Appetite good.   VITAL SIGNS/ANCILLARY NOTES: **Vital Signs.:   15-Oct-14 08:40  Temperature Temperature (F) 98.3  Celsius 36.8  Temperature Source oral  Pulse Pulse 67  Respirations Respirations 16  Systolic BP Systolic BP 053  Diastolic BP (mmHg) Diastolic BP (mmHg) 84  Mean BP 97  Pulse Ox % Pulse Ox % 97  Pulse Ox Activity Level  At rest  Oxygen Delivery Room Air/ 21 %   Brief Assessment:  GEN well developed, well nourished, no acute distress   Cardiac Regular   Respiratory normal resp effort   Gastrointestinal Normal   Gastrointestinal details normal Soft  Nontender  Nondistended  Bowel sounds normal   EXTR negative cyanosis/clubbing, negative edema   Additional Physical Exam Skin: pink, warm, dry   Assessment/Plan:  Assessment/Plan:  Assessment Crohn's flare with pSBO: She has responded very well to IV steroids.  No need for surgical intervention at this time. Heartburn:  will add PPI daily   Plan 1) Pt to be discharged home on Prednisone 79m daily for 2 weeks, wil arrange taper outpatient based on her progress.  Pt had no access to ENTOCORT due to expense. 2) Continue PENTASA 1 GRAM QID  3) Lactose free diet 4) Add Omeprazole 288mdaily  5) Office visit in 2 weeks, call sooner if needed   Electronic Signatures: JoAndria MeuseNP)  (Signed 15-Oct-14 11:56)  Authored: Chief Complaint, VITAL SIGNS/ANCILLARY NOTES, Brief Assessment, Assessment/Plan   Last Updated: 15-Oct-14 11:56 by JoAndria MeuseNP)

## 2015-02-25 NOTE — Consult Note (Signed)
Brief Consult Note: Diagnosis: Crohn's flare.   Consult note dictated.   Comments: Ms. Plude is a very pleasant 49 y/o caucasian female admitted with diarrhea, nausea, vomiting & abdominal pain.  CT shows dilated small bowel loops up to 3.2 cm with A/F level & transition point in distal ileum with inflammation secondary to Crohn's.  Pt has never had surgery for Crohn's dx 8 years ago.  She has only been on steroids once or twice that she can recall & has not been on any medications for her Crohn's in the past year.  Plan: 1) Agree with supportive measures 2) Agree with Cipro 442m BID, flagyl 506mTID & solumedrol 2080mID 3) Agree with surgical consult  Thanks for consult.  Please see full dictated note. #[id number] Signatures: JonAndria MeuseP)  (Signed 13-Oct-14 12:57)  Authored: Brief Consult Note   Last Updated: 13-Oct-14 12:57 by JonAndria MeuseP)

## 2015-02-25 NOTE — Consult Note (Signed)
Chief Complaint:  Subjective/Chief Complaint Pt denies any abdominal pain, nausea or vomiting.  She has had 3 soft liquid bowel movements today without bleeding.  Tolerating full liquids well.   VITAL SIGNS/ANCILLARY NOTES: **Vital Signs.:   14-Oct-14 08:15  Vital Signs Type Routine  Celsius 36.5  Temperature Source oral  Pulse Pulse 73  Respirations Respirations 20  Systolic BP Systolic BP 200  Diastolic BP (mmHg) Diastolic BP (mmHg) 81  Mean BP 94  BP Source  if not from Vital Sign Device non-invasive  Pulse Ox % Pulse Ox % 98  Pulse Ox Activity Level  At rest  Oxygen Delivery Room Air/ 21 %   Brief Assessment:  GEN well developed, well nourished, no acute distress   Cardiac Regular   Respiratory normal resp effort   Gastrointestinal Normal   Gastrointestinal details normal Soft  Nontender  Nondistended  Bowel sounds normal   EXTR negative cyanosis/clubbing, negative edema   Additional Physical Exam Skin: pink, warm, dry   Lab Results:  Routine Hem:  14-Oct-14 04:58   WBC (CBC)  14.0  RBC (CBC) 4.33  Hemoglobin (CBC) 12.9  Hematocrit (CBC) 38.1  Platelet Count (CBC) 302  MCV 88  MCH 29.7  MCHC 33.7  RDW 13.3  Neutrophil % 89.8  Lymphocyte % 6.2  Monocyte % 3.9  Eosinophil % 0.0  Basophil % 0.1  Neutrophil #  12.6  Lymphocyte #  0.9  Monocyte # 0.5  Eosinophil # 0.0  Basophil # 0.0 (Result(s) reported on 18 Aug 2013 at 05:46AM.)   Assessment/Plan:  Assessment/Plan:  Assessment Crohn's flare with pSBO: She has responded very well to IV steroids.  No need for surgical intervention at this time.  We discussed her previous medical non-compliance & future care with mesalamine & entocort.   Plan 1) Begin ENTOCORT 76m daily & she will continue for 1 month, then taper 2) She will start PENTASA 1 GRAM QID  3) Lactose free diet 4) Advance diet from full liquids to mechanical soft for next few days, avoid spicy, greasy foods 5) Office visit in 2-3 weeks with  Dr WAllen Norris call sooner if needed 6) May DC home in next 24 hrs if tolerating diet & PO meds   Electronic Signatures: JAndria Meuse(NP)  (Signed 14-Oct-14 12:02)  Authored: Chief Complaint, VITAL SIGNS/ANCILLARY NOTES, Brief Assessment, Lab Results, Assessment/Plan   Last Updated: 14-Oct-14 12:02 by JAndria Meuse(NP)

## 2015-02-25 NOTE — Consult Note (Signed)
Pt seen and examined. Full consult to follow.  Known hx of Crohn's hx. Used to be on apriso 4 daily with Dr. Dionne Milo. Stopped taking it last year when she lost her medicaid. Chronic diarrhea. Acute abd pain yest AM with nausea/vomiting. Started on IV steroids. Feeling much better today. Having bowel movements. No nausea. Clear liquid diet started. Keep on IV steroids for another day or two. Then, switch to po steroids with taper over 1-2 wks. Will need either aprios or asacol or similar meselamine whichever is cheaper long term once steroids tapered. Advance diet as tolerated to low residue diet. I will be out at [facility] Pt will need GI f/u after discharge. Thanks  Electronic Signatures: Verdie Shire (MD)  (Signed on 13-Oct-14 13:39)  Authored  Last Updated: 13-Oct-14 13:39 by Verdie Shire (MD)

## 2015-02-25 NOTE — Consult Note (Signed)
PATIENT NAME:  [name], [name] MR#:  [id number] DATE OF BIRTH:  1966/05/03  DATE OF CONSULTATION:  08/17/2013  REFERRING PHYSICIAN:  Vivek J. Verdell Carmine, MD CONSULTING PHYSICIAN:  Lucilla Lame, MD; Andria Meuse, NP  PRIMARY CARE PHYSICIAN: Does not have one.   PREVIOUS GASTROENTEROLOGIST: Dr. Dionne Milo  REASON FOR CONSULTATION: Abdominal pain, nausea, vomiting and Crohn's.   HISTORY OF PRESENT ILLNESS: [name] is a pleasant 49 year old Caucasian female who tells me she was diagnosed with Crohn's disease 8 years ago. For the past year, she has been off all medications and her disease has been relatively well-controlled. However, over the last several months, she is beginning to notice some chronic diarrhea and intermittent abdominal pain. She was previously on Pentasa and Asacol. She has had 1 to 2 episodes where she has been on steroids over the last 8 years. She has never had surgery for her Crohn's disease. She was having upwards of 10 loose stools per day. She denies any rectal bleeding or melena, but did see large amounts of mucus in her stool. She woke up with bloating and sharp upper abdominal pain. She vomited 3 times. She denies any fever or chills. Denies any ill contacts. Her weight has been stable. Her appetite has been fine. She denies any NSAIDs. She was admitted with a white blood cell count of 17.4. It is down to 12.7 today. Her C-reactive protein was 17.3. She was started on IV Solu-Medrol 20 mg q.6 hours, Cipro 400 b.i.d. and Flagyl 500 mg t.i.d. She had a CT scan of the abdomen and pelvis with oral and IV contrast, which showed dilated small bowel loops up to 3.2 cm with air-fluid levels and a transition point in the distal ileum with bowel wall thickening/enteritis.   Last EGD was by Dr. Dionne Milo in May 2011 and showed duodenal erosions, and last colonoscopy was 07/13/2009 by Dr. Dionne Milo and showed descending colon and terminal ileal changes of Crohn's.   PAST MEDICAL AND  SURGICAL HISTORY: Crohn's disease, anxiety, complete hysterectomy for fibroids and cholecystectomy.   MEDICATIONS PRIOR TO ADMISSION: Denies any.   ALLERGIES: No known drug allergies.   FAMILY HISTORY: There is a family history positive for a paternal cousin with Crohn's. She also has a sister with IBS.   SOCIAL HISTORY: She has a 30+ pack-year history of tobacco use. Denies any alcohol or illicit drug use. She is married. She has 3 healthy children, 2 sons and 1 daughter. Her mother-in-law and her 28 year old daughter reside at home.   REVIEW OF SYSTEMS: Otherwise negative complete 12-point review of systems.   PHYSICAL EXAMINATION: VITAL SIGNS: Temperature 98.2, pulse 93, respirations 20, blood pressure 108/72, oxygen saturation 95%.  GENERAL: She is a well-developed, well-nourished Caucasian female in no acute distress. She is accompanied by her husband.  HEENT: Sclerae clear, anicteric. Conjunctivae pink. Oropharynx pink and moist without any lesions.  NECK: Supple without mass or thyromegaly.  HEART: Regular rate and rhythm. Normal S1, S2 without murmurs, rubs or gallops.  LUNGS: Clear to auscultation bilaterally.  ABDOMEN: Positive bowel sounds x 4, mildly distended. There is no rebound tenderness or guarding. No hepatosplenomegaly or mass.  EXTREMITIES: Without clubbing or edema.  SKIN: Pink, warm and dry without any rash or jaundice.  NEUROLOGIC: Grossly intact.   MUSCULOSKELETAL: Good equal movement and strength bilaterally.  PSYCHIATRIC: Alert, cooperative, normal mood and affect.   IMPRESSION: [name] is a very pleasant 49 year old Caucasian female admitted with diarrhea, nausea, vomiting and abdominal pain.  Her CT shows dilated small bowel loops up to 3.2 cm with air-fluid levels and transition point in the distal ileum with inflammation secondary to Crohn's. The patient has never had surgery for her Crohn's disease that was diagnosed over 8 years ago. She has only been on  steroids once or twice that she can recall and has not been on any medications for her Crohn's in the past year. She was previously on Asacol and Pentasa.   PLAN: 1.  Agree with supportive measures.  2.  Agree with Cipro 400 mg b.i.d., Flagyl 500 mg t.i.d. and Solu-Medrol 20 mg q.i.d.  3.  Once she is able to tolerate p.o. medication, she can be switched to Entocort.  4.  Agree with surgical consult.   Thank you for allowing [place] to participate in the care of Ms. Cruzen.   ____________________________ Andria Meuse, NP klj:jm D: 08/17/2013 13:04:05 ET T: 08/17/2013 13:41:59 ET JOB#: [id number]  cc: Andria Meuse, NP, <Dictator> Andria Meuse FNP ELECTRONICALLY SIGNED 08/26/2013 10:28

## 2015-02-25 NOTE — Consult Note (Signed)
PATIENT NAME:  [name], ZUNO MR#:  [id number] DATE OF BIRTH:  Sep 09, 1966  DATE OF CONSULTATION:  08/17/2013  REFERRING PHYSICIAN:  Lorelee Market, MD CONSULTING PHYSICIAN:  Rodena Goldmann III, MD  CHIEF COMPLAINT: Abdominal pain, nausea, vomiting and diarrhea.   BRIEF HISTORY: [name] is a pleasant 49 year old woman with a known diagnosis of Crohn's disease, who presented to the Emergency Room with abdominal pain, nausea, vomiting and diarrhea. She noted increased abdominal pain almost "like labor pains" then 3 episodes of vomiting following the onset of pain. She continued to have significant abdominal discomfort and presented the Emergency Room for further evaluation. CT scan suggested increased inflammation in her distal small bowel consistent with Crohn's disease, but also possible partial small bowel obstruction. She has been treated with antibiotics and steroids and is improving and currently does not have any nausea and her abdominal pain is better. She is tolerating a liquid diet.   PAST MEDICAL HISTORY: She has history of Crohn's disease, but has not had any therapy for the last year since she lost her medical insurance.   PAST SURGICAL HISTORY: Only previous surgery is a laparoscopic hysterectomy and laparoscopic cholecystectomy. She has not had any previous surgery for her inflammatory bowel disease. She denies history of hepatitis, yellow jaundice, pancreatitis, peptic ulcer disease or diverticulitis.   SOCIAL HISTORY:  She is a cigarette smoker and does not drink alcohol regularly.   FAMILY HISTORY: Noncontributory other than she has a cousin, who has a history of inflammatory bowel disease.   REVIEW OF SYSTEMS: Reviewed with the patient and admission workup and I agree with the findings and have no further comments to add in that regard.   PHYSICAL EXAMINATION:  GENERAL: She is alert, pleasant, comfortable woman eating a clear liquid diet at the time of evaluation.  VITAL  SIGNS: She is afebrile. Blood pressure is 110/70. Heart rate is 92 and regular.  HEENT: Unremarkable. She has no scleral icterus. No pupillary abnormalities.  NECK: Supple, nontender with a midline trachea. No adenopathy.  CHEST: Clear with some very faint breath sounds and some possible expiratory wheezing. She has normal pulmonary excursion.  CARDIAC: No murmurs or gallops to my ear she seems to be in normal sinus rhythm.  ABDOMEN: Slightly distended, but nontender with no rebound, no guarding, no masses and no hernias. She does have active bowel sounds.  LOWER EXTREMITIES: Reveals full range of motion, no deformities. Good distal pulses.  PSYCHIATRIC: Normal orientation and normal affect.   IMPRESSION: This woman presents with, what appears to be, a flare of her Crohn's disease. I am unable to access her CT scan through the computer system as the radiologic system is down at the present time; however, from the report she did have evidence of peripheral small bowel obstruction. Those symptoms appear to be resolved with the current therapy. I suspect she needs more aggressive medical management of her known Crohn's disease. Should her symptoms worsen or not resolve, then surgery would certainly be an option. Will continue to follow her while she is hospitalized.   ____________________________ Rodena Goldmann III, MD rle:aw D: 08/17/2013 10:57:57 ET T: 08/17/2013 11:12:50 ET JOB#: [id number]  cc: Rodena Goldmann III, MD, <Dictator> Meindert A. Brunetta Genera, [place] MD ELECTRONICALLY SIGNED 08/22/2013 17:48

## 2015-02-25 NOTE — H&P (Signed)
PATIENT NAME:  [name], [name] MR#:  [id number] DATE OF BIRTH:  11-Jul-1966  DATE OF ADMISSION:  08/16/2013  PRIMARY CARE PHYSICIAN:  Does not have one.   CHIEF COMPLAINT:  Abdominal pain, nausea, vomiting.   HISTORY OF PRESENT ILLNESS: This is a 49 year old female who presents to the hospital with abdominal pain, nausea, vomiting ongoing since yesterday morning. The patient says her abdomen feels more distended, she feels bloated, and she has had 3 episodes of vomiting since yesterday. She has not been able to keep anything down now since yesterday morning. She denies any fever, does admit to some chills. She also admits to having mucoid stools all the time, given her history of Crohn's disease. The patient apparently has not seen a gastroenterologist in over a year. She used to see Dr. Dionne Milo for her Crohn's disease, and has not taken any meds for over a year. She now presents with the above symptoms as mentioned. The patient's CT scan is suggestive of inflammation in her terminal ileum and her small bowel, consistent with Crohn's disease, but also suggestive of questionable partial small bowel obstruction. Hospitalist services were contacted for further treatment and evaluation.   REVIEW OF SYSTEMS:   CONSTITUTIONAL: No documented fever. No weight gain or weight loss.  EYES: No blurry or double vision.  ENT: No tinnitus. No postnasal drip. No redness of the oropharynx.  RESPIRATORY: No cough, no wheeze, no hemoptysis, no dyspnea.  CARDIOVASCULAR: No chest pain, no orthopnea, no palpitations, no syncope.  GASTROINTESTINAL: Positive nausea. Positive vomiting. No diarrhea. No melena or hematochezia. Positive left upper quadrant abdominal pain.  GENITOURINARY: No dysuria, no hematuria.  ENDOCRINE: No polyuria or nocturia. No heat or cold intolerance.  HEMATOLOGIC: No anemia, no bruising or bleeding.  INTEGUMENT: No rashes. No lesions.  MUSCULOSKELETAL: No arthritis. No swelling. No gout.   NEUROLOGIC: No numbness or tingling. No ataxia. No seizure-type activity.  PSYCHIATRIC: Positive anxiety. No insomnia. No ADD.   PAST MEDICAL HISTORY: Consistent with Crohn's disease and anxiety.   PAST SURGICAL HISTORY: Consistent with hysterectomy and cholecystectomy.   SOCIAL HISTORY: She does smoke about a pack per day, has been smoking for the past 30+ years. No alcohol abuse. No illicit drug abuse. Lives at home with her husband.   FAMILY HISTORY: Both mother and father are alive. They both have coronary disease. She also has a sister who has irritable bowel syndrome and coronary disease. She has a cousin also who has inflammatory bowel disease.   MEDICATIONS: She is currently on no medications.   PHYSICAL EXAMINATION IS AS FOLLOWS: VITAL SIGNS ARE NOTED TO BE: Temperature 98.3, pulse 109, respirations 18, blood pressure 119/76, sats 96% on room air.  GENERAL: The patient is a pleasant-appearing female in no apparent distress.  HEAD, EYES, EARS, NOSE, THROAT EXAM: The patient is atraumatic, normocephalic. Extraocular muscles are intact. Pupils are equal and reactive to light. Sclerae are anicteric. No conjunctival injection. No pharyngeal erythema.  NECK: Supple. There is no jugular venous distention. No bruits, no lymphadenopathy, no thyromegaly.  HEART EXAM: Regular rate and rhythm. No murmurs. No rubs. No clicks.  LUNGS: Clear to auscultation bilaterally. No rales, no rhonchi, no wheezes.  ABDOMEN: Soft, flat. Tender in the left upper quadrant, but no rebound, no rigidity. Hypoactive bowel sounds. No hepatosplenomegaly appreciated.  EXTREMITIES: No evidence of any cyanosis, clubbing, or peripheral edema. Has +2 pedal and radial pulses bilaterally.  NEUROLOGIC: The patient is alert, awake, oriented x 3, with no focal motor  or sensory deficits appreciated bilaterally.  SKIN EXAM:  Moist and warm, with no rashes appreciated.  LYMPHATIC: There is no cervical or axillary  lymphadenopathy.   LABORATORY EXAM: Showed a serum glucose of 104, BUN 8, creatinine 0.7, sodium 137, potassium 4.1, chloride 107, bicarb 25. The patient's LFTs are within normal limits. White cell count 17.4, hemoglobin of 16.6, hematocrit 49.4, platelet count 376.   The patient did have a CT abdomen and pelvis done with contrast, which showed wall thickening and edematous change in the terminal and distal ileum consistent with Crohn's disease. Resulting small bowel obstruction with dilatation of the remainder of the more proximal small bowel. A 3 cm low attenuation lesion in the left adnexa, possible ovarian cyst.   ASSESSMENT AND PLAN: This is a 49 year old female with a history of Crohn's disease, anxiety, tobacco abuse, who presents to the hospital with abdominal pain, nausea, vomiting, and CT scan findings suggestive of Crohn's disease and possible small bowel obstruction.   1.  Abdominal pain, nausea, vomiting. This is likely related to a flare up of Crohn's disease, as patient has not been on any medications now for the past year. For now, will continue supportive care with IV fluids, antiemetics, and pain control. Keep her n.p.o. for now. Will start her on some IV steroids. Also empirically give her ciprofloxacin and Flagyl, given her leukocytosis. Follow her clinically. I will also get a GI consult. I discussed the case with Dr. Candace Cruise, who will see the patient tomorrow. I will get a sed rate and CRP.   2.  Leukocytosis. This is likely secondary to the Crohn's disease flare. Will follow after antibiotic therapy.  3.  Tobacco abuse. Continue with the nicotine patch.   4.  Abnormal CT scan. The patient's CT scan was also suggestive of partial small bowel obstruction, although patient had diarrhea today. Her abdomen does not appear to be surgical. I will get a Surgical consult to further look at her CT and evaluate her.   The patient is a FULL CODE.   Time spent with the admission is 50 minutes.      ____________________________ Belia Heman. Verdell Carmine, MD vjs:mr D: 08/16/2013 18:30:03 ET T: 08/16/2013 19:06:27 ET JOB#: [id number]  cc: Belia Heman. Verdell Carmine, MD, <Dictator> Henreitta Leber MD ELECTRONICALLY SIGNED 08/18/2013 11:16

## 2015-03-06 NOTE — Discharge Summary (Signed)
PATIENT NAME:  [name], [name] MR#:  [id number] DATE OF BIRTH:  June 23, 1966  DATE OF ADMISSION:  02/16/2015 DATE OF DISCHARGE:  02/18/2015  ADMITTING PHYSICIAN: Epifanio Lesches, MD  DISCHARGING PHYSICIAN: Gladstone Lighter, MD  Center: Gastroenterology by Dr. Candace Cruise and Dr. Rayann Heman.  PRIMARY GASTROENTEROLOGIST: Lucilla Lame, MD  PRIMARY CARE PHYSICIAN: None.  DISCHARGE DIAGNOSES: 1.  Acute on chronic Crohn disease flare.  2.  Chronic obstructive pulmonary disease.   DISCHARGE HOME MEDICATIONS:  1.  Dicyclomine 10 mg 2 capsules 4 times a day as needed for abdominal pain or cramping. 2.  Mesalamine 1000 mg p.o. 4 times a day.  3.   Cholestyramine 4 grams orally twice a day.  4.  Ventolin inhaler 2 puffs 4 times a day as needed for wheezing.  5.  Prednisone taper over 1 month.  6.  Humira every 2 weeks.  DISCHARGE DIET: Regular.  DISCHARGE ACTIVITY: As tolerated.   FOLLOWUP INSTRUCTIONS: 1.  Follow up with gastroenterology, Dr. Lucilla Lame, in 1 to 2 weeks.  2.  [facility] inflammatory bowel disease specialist referral was also given.  LABORATORIES AND IMAGING STUDIES PRIOR TO DISCHARGE: Sodium 141, potassium 3.7, chloride 111, bicarb 27, BUN 9, creatinine 0.51, glucose 126, calcium 8.8.   KUB on the day prior to discharge showed dilated small bowel loops which have improved. No evidence of any obstruction noted.   WBC 9, hemoglobin 13.3, hematocrit 40.4, platelet count 246,000.   CT of the abdomen and pelvis with contrast on admission is showing active Crohn disease, areas of distal small bowel inflammation, possible areas of stricture narrowing which could cause functional small bowel obstruction. Chronic changes of diffuse pancolitis without acute inflammation noted. No intra-abdominal abscess noted.  BRIEF HOSPITAL COURSE: [name] is a 49 year old female with past medical history of Crohn disease, on Humira, following up with Dr. Lucilla Lame, presents to the  hospital secondary to worsening nausea, bloating and abdominal pain. She has chronic diarrhea secondary to Crohn disease, but denies any blood in her stools.  1.  Acute on chronic Crohn disease flare. On Humira, which will be continued as an outpatient, started on steroids in the hospital. Her mesalamine was stopped as an outpatient, but it was restarted in the hospital. Her abdominal pain, bloating, nausea, and vomiting symptoms have improved. CT showed multiple areas of active inflammation with possible strictures. Followup KUB did not show any obstruction. The patient was able to eat her diet okay without any problem. No blood in the stools. Cholestyramine was added for her chronic diarrhea because it got worse since her gallbladder surgery. Her symptoms have significantly improved. She was advanced on a regular diet and is being discharged. She was strongly advised to follow up with Dr. Lucilla Lame and also [facility] clinic. There is a possibility of ileal resection if her symptoms would get worse.   DISCHARGE CONDITION: Stable.   DISCHARGE DISPOSITION: Home.   TIME SPENT ON DISCHARGE: 40 minutes.  ____________________________ Gladstone Lighter, MD rk:sb D: 02/25/2015 10:38:20 ET T: 02/25/2015 11:34:17 ET JOB#: [id number]  cc: Gladstone Lighter, MD, <Dictator> Lucilla Lame, MD Gladstone Lighter MD ELECTRONICALLY SIGNED 02/25/2015 15:54

## 2015-03-06 NOTE — Consult Note (Signed)
PATIENT NAME:  [name], [name] MR#:  [id number] DATE OF BIRTH:  1965/12/30  DATE OF CONSULTATION:  02/17/2015  REFERRING PHYSICIAN:   CONSULTING PHYSICIAN:  [name], ANP (Adult Nurse Practitioner)  REFERRING PHYSICIAN:  Epifanio Lesches, MD.    CONSULTING PHYSICIAN:  Verdie Shire, MD/[name], ANP .    REASON FOR CONSULTATION: Crohn's flare.   PRIMARY GASTROENTEROLOGIST: Lucilla Lame, MD.   HISTORY OF PRESENT ILLNESS: This 49 year old patient with history of Crohn disease has been on Humira every other week over the last year. She reports she has been taking the medication regularly, but has not been able to follow up with her gastroenterologist due to the lack of health insurance and high cost of office co-pay.   She reports that she developed acute abdominal pain, bloating, last week associated with worsening diarrhea and vomited 3 times. She felt a little lightheaded.  She presented to the Emergency Room on Saturday, but the wait was too long so she went home to bed. Over the next few days, she has developed more bloating and her bowel movements stopped. She passed just a tiny piece on admission yesterday. She was concerned about a partial small bowel obstruction once again. She denies fevers or chills, hematemesis, rectal bleeding.   A CT of the abdomen and pelvis with contrast performed showed dilated small bowel loops with scattered air-fluid levels, markedly inflamed right lower quadrant distal loop of the ileum consistent with active Crohn disease. There was mucosal and serosal enhancement and mild narrowing of the bowel. The colon is diffusely abnormal with probable fibrofatty infiltrative changes suggesting chronic pancolitis, no acute inflammation. There were small scattered lymph nodes. There was moderate distal aortic calcification for age. Impression was that she had active Crohn disease with distal small bowel inflammation and possible areas of strictured narrowing  causing functional small bowel obstruction. She was started on IV steroids, received IV pain medication, and is markedly improved today.   Record review shows colonoscopy 12/14/2013 for a followup of Crohn disease and diarrhea, notable for stricture of the ileocecal valve, one 9 mm polyp, scarred mucosa in the entire colon and several biopsies were taken. She had an upper endoscopy for nausea and vomiting and that revealed gastritis and duodenitis. She reports a history of Pentasa use, but was taken off of that when she was placed on Humira last year. She has had no prior trial of 6-MP or Imuran. She does report her diarrhea became much worse 2 years ago after her cholecystectomy. She denies trial of Questran. She denies recent antibiotic therapy or history of Clostridium difficile colitis.   PAST MEDICAL HISTORY:   1.Crohn disease, now on Humira, previous use of Pentasa which is mesalamine product.   PAST SURGICAL HISTORY:  1. Laparoscopic hysterectomy.   2. Laparoscopic cholecystectomy.  3. No prior surgery for Crohn disease.   MEDICATIONS:   1. Dicyclomine 10 mg 2 capsules 4 times daily as needed.  2. Humira 40 mg subcutaneous every other week.  3. She avoids nonsteroidal anti-inflammatory drugs.     ALLERGIES: NKDA.   FAMILY HISTORY: A cousin with Crohn disease. Negative for colon cancer, colon polyps.   REVIEW OF SYSTEMS: 12 systems reviewed, positive for bladder incontinence and  her family thinks she sleeps a lot. She has been unable to work for 1 year due to diarrhea. The patient describes diarrhea as usually postprandial. She wakes up and has 3 movements in the morning even before breakfast. If she is going  out for the day she does not eat all day long. She does drink water during the day. If she eats normally she will have 1-2 urgent bowel movements less than 30 minutes after eating. She has noted no blood or melena. Remaining 12 systems otherwise negative.   PHYSICAL EXAMINATION:   VITAL SIGNS: 97.7, 72, 19, 100/74, pulse oximetry on room air is 97%.  GENERAL: Well-appearing Caucasian female resting in bed with her family. She appears comfortable.  HEENT: Head is normocephalic. Conjunctivae pink. Sclerae are anicteric. Oral mucosa is moist, intact.  NECK: Supple. Trachea midline.  CARDIAC: S1, S2 without murmur or gallop.  LUNGS: CTA. Respirations are eupneic.  ABDOMEN: Soft, mild abdominal tenderness. Positive bowel sounds, no distention. RECTAL: Deferred.  SKIN: Warm and dry without rash or edema.  MUSCULOSKELETAL: No joint swelling or inflammation noted.  NEUROLOGIC: Cranial nerves II through XII grossly intact.  PSYCHOLOGIC: Affect and mood within normal, very pleasant.   LABORATORY STUDIES: Admission laboratory work with glucose 131, BUN 13, creatinine 0.75, electrolytes normal. Total protein 8.2, albumin 4.6. Liver panel normal. Troponin negative. WBC 16.9, hemoglobin 18.7.   Repeat laboratory studies 02/17/2015 with MET-B unremarkable except for glucose 122. WBC is 9.0, hemoglobin 13.3. This is on steroids.   RADIOLOGY: CT of the abdomen and pelvis shows Crohn's changes as noted, shows active Crohn disease with areas of distal small bowel inflammation or possible areas of stricture narrowing. This is causing a functional small bowel obstruction. Probable changes of diffuse pancolitis without acute colitis.   IMPRESSION: This patient has a history of Crohn disease with ileitis and partial small bowel obstruction. 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 She has had hospitalization last year and was placed on Humira. The patient is taking the Humira every 2 weeks as she is getting it from the drug company for free. The patient reports she is unable to follow up with her gastroenterologist due to lack of health insurance. She has not been seen since her last hospitalization. 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 The patient has not had recent stool studies or antibiotic therapy. She seems to be responding very well to IV Solu-Medrol and pain medication.   PLAN:  1.  Obtain KUB in the morning.  2.  Switch to p.o. prednisone with gradual taper.  3.  Continue Humira indefinitely.  4.  I spoke with the social worker and case manager here and they we will give her information on how to acquire Medicaid.  They have also given her numbers to establish care with [facility].   This case was discussed with Dr. Candace Cruise in collaboration of care. No indication for surgical consultation at this time. She may need to have repeat colonoscopy as an outpatient.   Thank you for this consultation.   These services provided by Kellie Millin A. Jerelene Redden, MS, APRN, BC, ANP under collaborative agreement with Verdie Shire, MD.     ____________________________ [name], ANP (Adult Nurse Practitioner) kam:bu D: 02/17/2015 18:13:50 ET T: 02/17/2015 18:40:58 ET JOB#: [id number]  cc: [name], ANP (Adult Nurse Practitioner), <Dictator> Kellie Harder Sherlyn Hay, MSN, ANP-BC Adult Nurse Practitioner ELECTRONICALLY SIGNED 02/18/2015 8:03

## 2015-03-06 NOTE — Consult Note (Signed)
Details:   - GI Note:  Doing better today.  Pain decreased.  No f/c, n/v  Exam: vss abd: nabs, soft, mild diffuse ttp  A/P: - month long pred taper - f/u with curent Crohn's specialist ( Dr Allen Norris) and request referral to [facility]).  - cont Humira.   Electronic Signatures: Arther Dames (MD)  (Signed 15-Apr-16 12:57)  Authored: Details   Last Updated: 15-Apr-16 12:57 by Arther Dames (MD)

## 2015-03-06 NOTE — H&P (Addendum)
PATIENT NAME:  [name], [name] MR#:  [id number] DATE OF BIRTH:  10/24/1966  DATE OF ADMISSION:  02/16/2015  PRIMARY PHYSICIAN: Lucilla Lame, MD from GI  EMERGENCY ROOM PHYSICIAN: Yetta Numbers. Karma Greaser, MD   CHIEF COMPLAINT: Nausea, abdominal pain.   HISTORY OF PRESENT ILLNESS: A 49 year old female patient with history of Crohn disease on Humira, comes in because of abdominal pain, bloating, and multiple episodes of nausea and vomiting. The patient has chronic diarrhea secondary to Crohn disease, but she denies any blood in her stools. Main complaint is abdominal pain, nausea, vomiting, which is going on since yesterday. No fever. She is on Humira every 15 days. She is also on dicyclomine at home. She denies any fever. No oral ulcers.   PAST MEDICAL HISTORY: Significant for Crohn disease and she is right now on Humira, but she is not on any medications like mesalamine.  PAST SURGICAL HISTORY: Significant for history of hysterectomy, cholecystectomy. No surgeries for her Crohn disease.  SOCIAL HISTORY: Smokes about less than a pack a day. No alcohol. No drugs.   FAMILY HISTORY: No history of Crohn's in her mother or father, but her cousin had Crohn's.   ALLERGIES: No known allergies.   MEDICATIONS: Dicyclomine 10 mg 2 capsules 4 times daily. She is on Humira 40 mg subcutaneous every other week.   REVIEW OF SYSTEMS:  CONSTITUTIONAL: Has no fever. Does have some fatigue, nausea, multiple episodes of vomiting yesterday and today.  EYES: No blurred vision.  ENT: No tinnitus. No epistaxis. No difficulty swallowing. No oral ulcers.  RESPIRATIONS: No cough. No wheezing.  CARDIOVASCULAR: No chest pain, no orthopnea, no PND, no palpitations.  GASTROINTESTINAL: Has nausea, vomiting, and abdominal pain. Has diarrhea which is chronic for her, but no recent change in her bowel movements. No blood in the stools.  GENITOURINARY: No dysuria.  ENDOCRINE: No polyuria or nocturia. HEMATOLOGIC: The patient has no  anemia.  NEUROLOGIC: No weakness. No numbness. no strokes.  PSYCHIATRIC: No anxiety or insomnia.   PHYSICAL EXAMINATION:  VITAL SIGNS: The patient is a well-developed, well-nourished female not in distress. HEAD: Atraumatic, normocephalic. EYES: Pupils equal, reacting to light. Extraocular movements intact.  ENT: No tympanic membrane congestion, NECK: Supple. No JVD. No carotid bruit.  RESPIRATORY: Bilaterally clear to auscultation. No wheeze noted.  CARDIOVASCULAR: S1, S2 regular. No murmurs. PMI not displaced. Pulses equal at carotid, femoral, and dorsalis pedis.  GASTROINTESTINAL: Has mild abdominal tenderness present. No rebound tenderness. Bowel sounds are diminished. No organomegaly. No hernias.  MUSCULOSKELETAL: Strength 5/5 upper and lower extremities. Able to move all extremities. The patient has no cyanosis or clubbing.  SKIN: Inspection is normal, slightly dehydrated.  NEUROLOGIC: Cranial nerves II-XII intact. Power 5/5 in upper and lower extremities. Sensory intact. DTRs 2+ bilaterally.   LABORATORY DATA: Lactic acid 0.8. Urine normal. Troponin less than 0.03. WBC 16.9, hemoglobin 18.7, hematocrit 57.2, platelets 367,000. Electrolytes: Sodium 139, potassium 4.1, chloride is 104, bicarbonate 26, BUN 13, creatinine 0.75, glucose 131.   The patient's abdominal CAT scan shows active colitis with areas of distal small bowel inflammation and possibly areas of stricture narrowing. Probable chronic changes with diffuse pancolitis without acute inflammation. No intra-abdominal abscess. Scattered free fluid.  ASSESSMENT AND PLAN:  The patient is a 49 year old female patient with Crohn disease, on immunosuppressive therapy with Humira, comes in because of abdominal pain, nausea, and vomiting, likely has a Crohn flare. The patient is admitted to medical service, started on IV steroids at Solu-Medrol 60 mg daily,  continue IV fluids, treat her nausea with IV Zofran, and consult GI. Start her on a  lactose-free diet. She has a partial small bowel obstruction secondary to active Crohn's and Dr. Allen Norris has been contacted by ER physician, and there is no recommendation to include surgery at this time. Follow clinically. The patient needs IV steroids . The patient was on Entocort before, but it was stopped because of the cost. Start her on Pentasa 1 gram q.i.d. as well.  TIME SPENT: Fifty-five minutes.    ____________________________ Epifanio Lesches, MD sk:TM D: 02/16/2015 19:38:38 ET T: 02/16/2015 21:21:26 ET JOB#: [id number]  cc: Epifanio Lesches, MD, <Dictator> Epifanio Lesches MD ELECTRONICALLY SIGNED 03/08/2015 21:12

## 2015-03-06 NOTE — Consult Note (Signed)
Pt seen and examined. Please see [name]' notes. Pt with Crohn's flare. Has evidence of Crohn's ileocolitis with pSBO. 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 Has been on Humira q 2 weeks for 1 year. Last dose past Wed. Was followed by Tamsen Snider NP but could not see her x 6 months due to NO insurance and inability to pay $50 copay on each visit. Chronic diarrhea until this current admission. Diarrhea worse since GB surgery 2 years ago. Could use questran for chronic diarrhea. Had been on 5 ASA products in the past w/o relief. No prior immunosuppressive agents, such as imuran or 6-MP.  Placed on IV steroids on admission. Overall feeling better. Will order KUB in AM. Agree with switcing to po prednisone with gradual taper. Continue Humira. If tolerates full liquid diet, ok to try low residue diet tomorrow. IF stable, ok for discharge soon. Agree with f/u at [facility] IBD service. If patient develops obstructive symptoms again, then may need ileal resection later. Will have Dr. Rayann Heman check on patient tomorrow. Thanks.   Electronic Signatures: Verdie Shire (MD) (Signed on 14-Apr-16 15:42)  Authored   Last Updated: 14-Apr-16 15:44 by Verdie Shire (MD)

## 2015-03-22 ENCOUNTER — Encounter (INDEPENDENT_AMBULATORY_CARE_PROVIDER_SITE_OTHER): Payer: Self-pay

## 2015-03-22 ENCOUNTER — Ambulatory Visit: Payer: Self-pay

## 2015-10-26 ENCOUNTER — Telehealth: Payer: Self-pay | Admitting: Gastroenterology

## 2015-10-26 NOTE — Telephone Encounter (Signed)
Received a call from Helene Kelp, a pharmacist with [employer]  - it is a patient assistance foundation that helps patients with Humira prescriptions. She left a voicemail needs to confirm continuation of Humira. She was initially prescribed Humira by Nurse Practioner, Vickey Huger. If they don't hear anything, they will discontinue Humira on 11/01/15. You may reach Helene Kelp at [phone] and then press option #4. Thanks.

## 2015-10-27 NOTE — Telephone Encounter (Signed)
Patient was called at this time to confirm use of medication as well as where she is currently following up. She states that she has stopped this medication and she is not following up with anyone at this time.  [employer] was notified of this information.

## 2015-11-26 ENCOUNTER — Emergency Department
Admission: EM | Admit: 2015-11-26 | Discharge: 2015-11-26 | Disposition: A | Discharge status: Home / Self Care | Payer: Self-pay | Attending: Emergency Medicine | Admitting: Emergency Medicine

## 2015-11-26 ENCOUNTER — Encounter: Payer: Self-pay | Admitting: Emergency Medicine

## 2015-11-26 DIAGNOSIS — F17200 Nicotine dependence, unspecified, uncomplicated: Secondary | ICD-10-CM | POA: Insufficient documentation

## 2015-11-26 DIAGNOSIS — K047 Periapical abscess without sinus: Secondary | ICD-10-CM | POA: Insufficient documentation

## 2015-11-26 HISTORY — DX: Chronic obstructive pulmonary disease, unspecified: J44.9

## 2015-11-26 HISTORY — DX: Crohn's disease, unspecified, without complications: K50.90

## 2015-11-26 MED ORDER — IBUPROFEN 800 MG PO TABS: 800.0000 mg | ORAL_TABLET | Freq: Three times a day (TID) | ORAL | Status: DC | PRN

## 2015-11-26 MED ORDER — HYDROCODONE-ACETAMINOPHEN 5-325 MG PO TABS: 1.0000 | ORAL_TABLET | ORAL | Status: DC | PRN

## 2015-11-26 MED ORDER — LIDOCAINE VISCOUS 2 % MT SOLN: 20.0000 mL | OROMUCOSAL | Status: DC | PRN

## 2015-11-26 MED ORDER — AMOXICILLIN 500 MG PO TABS: 500.0000 mg | ORAL_TABLET | Freq: Two times a day (BID) | ORAL | Status: DC

## 2015-11-26 NOTE — ED Provider Notes (Signed)
[facility] Emergency Department Provider Note  ____________________________________________  Time seen: Approximately 12:35 PM  I have reviewed the triage vital signs and the nursing notes.   HISTORY  Chief Complaint Dental Problem    HPI [name] is a 50 y.o. female presents for evaluation of right lower molar dental pain. Patient states that her gums are swollen and painful. States that she was eating food when she noticed her tooth was loosening. In terms onset 2 days ago worsened with eating and no relief with anything at all.   Past Medical History  Diagnosis Date  . COPD (chronic obstructive pulmonary disease) ([place])   . Crohn's disease ([place])     There are no active problems to display for this patient.   Past Surgical History  Procedure Laterality Date  . Eye surgery    . Abdominal hysterectomy    . Cholecystectomy      Current Outpatient Rx  Name  Route  Sig  Dispense  Refill  . amoxicillin (AMOXIL) 500 MG tablet   Oral   Take 1 tablet (500 mg total) by mouth 2 (two) times daily.   20 tablet   0   . HYDROcodone-acetaminophen (NORCO) 5-325 MG tablet   Oral   Take 1-2 tablets by mouth every 4 (four) hours as needed for moderate pain.   15 tablet   0   . ibuprofen (ADVIL,MOTRIN) 800 MG tablet   Oral   Take 1 tablet (800 mg total) by mouth every 8 (eight) hours as needed.   30 tablet   0   . lidocaine (XYLOCAINE) 2 % solution   Mouth/Throat   Use as directed 20 mLs in the mouth or throat as needed for mouth pain.   100 mL   0     Allergies Review of patient's allergies indicates no known allergies.  No family history on file.  Social History Social History  Substance Use Topics  . Smoking status: Current Every Day Smoker  . Smokeless tobacco: Not on file  . Alcohol Use: No    Review of Systems Constitutional: No fever/chills Eyes: No visual changes. ENT: No sore throat. For dental abscess and dental  pain Cardiovascular: Denies chest pain. Respiratory: Denies shortness of breath. Gastrointestinal: No abdominal pain.  No nausea, no vomiting.  No diarrhea.  No constipation. Genitourinary: Negative for dysuria. Musculoskeletal: Negative for back pain. Skin: Negative for rash. Neurological: Negative for headaches, focal weakness or numbness.  10-point ROS otherwise negative.  ____________________________________________   PHYSICAL EXAM:  VITAL SIGNS: ED Triage Vitals  Enc Vitals Group     BP 11/26/15 1211 123/88 mmHg     Pulse Rate 11/26/15 1211 104     Resp 11/26/15 1211 18     Temp 11/26/15 1211 98.1 F (36.7 C)     Temp Source 11/26/15 1211 Oral     SpO2 11/26/15 1211 98 %     Weight 11/26/15 1211 170 lb (77.111 kg)     Height 11/26/15 1211 5' (1.524 m)     Head Cir --      Peak Flow --      Pain Score 11/26/15 1211 4     Pain Loc --      Pain Edu? --      Excl. in [place]? --     Constitutional: Alert and oriented. Well appearing and in no acute distress. Head: Atraumatic. Nose: No congestion/rhinnorhea. Mouth/Throat: Mucous membranes are moist.  Oropharynx non-erythematous. Right lower  molar with obvious erythema of the gums and tenderness to the tooth. Neck: No stridor.   Cardiovascular: Normal rate, regular rhythm. Grossly normal heart sounds.  Good peripheral circulation. Respiratory: Normal respiratory effort.  No retractions. Lungs CTAB. Musculoskeletal: No lower extremity tenderness nor edema.  No joint effusions. Neurologic:  Normal speech and language. No gross focal neurologic deficits are appreciated. No gait instability. Skin:  Skin is warm, dry and intact. No rash noted. Psychiatric: Mood and affect are normal. Speech and behavior are normal.  ____________________________________________   LABS (all labs ordered are listed, but only abnormal results are displayed)  Labs Reviewed - No data to display    PROCEDURES  Procedure(s) performed:  None  Critical Care performed: No  ____________________________________________   INITIAL IMPRESSION / ASSESSMENT AND PLAN / ED COURSE  Pertinent labs & imaging results that were available during my care of the patient were reviewed by me and considered in my medical decision making (see chart for details).  Acute dental abscess. Rx given for amoxicillin 500 mg 3 times a day, Vicodin 5/325, Motrin 800 mg 3 times a day, and viscous lidocaine. Patient follow-up with local dentists with attached list provided at discharge. Patient voices no other emergency medical complaints at this time. ____________________________________________   FINAL CLINICAL IMPRESSION(S) / ED DIAGNOSES  Final diagnoses:  Abscess, dental      Arlyss Repress, PA-C 11/26/15 1246  Delman Kitten, MD 11/26/15 [phone]

## 2015-11-26 NOTE — ED Notes (Signed)
Pt reports having loose right lower molar for past 2 days.  She woke up this morning with swelling to right jaw.  Denies drainage or fevers.  Right lower jaw does have noted swelling in triage.

## 2015-11-26 NOTE — Discharge Instructions (Signed)
Dental Abscess A dental abscess is a collection of pus in or around a tooth. CAUSES This condition is caused by a bacterial infection around the root of the tooth that involves the inner part of the tooth (pulp). It may result from:  Severe tooth decay.  Trauma to the tooth that allows bacteria to enter into the pulp, such as a broken or chipped tooth.  Severe gum disease around a tooth. SYMPTOMS Symptoms of this condition include:  Severe pain in and around the infected tooth.  Swelling and redness around the infected tooth, in the mouth, or in the face.  Tenderness.  Pus drainage.  Bad breath.  Bitter taste in the mouth.  Difficulty swallowing.  Difficulty opening the mouth.  Nausea.  Vomiting.  Chills.  Swollen neck glands.  Fever. DIAGNOSIS This condition is diagnosed with examination of the infected tooth. During the exam, your dentist may tap on the infected tooth. Your dentist will also ask about your medical and dental history and may order X-rays. TREATMENT This condition is treated by eliminating the infection. This may be done with:  Antibiotic medicine.  A root canal. This may be performed to save the tooth.  Pulling (extracting) the tooth. This may also involve draining the abscess. This is done if the tooth cannot be saved. HOME CARE INSTRUCTIONS  Take medicines only as directed by your dentist.  If you were prescribed antibiotic medicine, finish all of it even if you start to feel better.  Rinse your mouth (gargle) often with salt water to relieve pain or swelling.  Do not drive or operate heavy machinery while taking pain medicine.  Do not apply heat to the outside of your mouth.  Keep all follow-up visits as directed by your dentist. This is important. SEEK MEDICAL CARE IF:  Your pain is worse and is not helped by medicine. SEEK IMMEDIATE MEDICAL CARE IF:  You have a fever or chills.  Your symptoms suddenly get worse.  You have a  very bad headache.  You have problems breathing or swallowing.  You have trouble opening your mouth.  You have swelling in your neck or around your eye.   This information is not intended to replace advice given to you by your health care provider. Make sure you discuss any questions you have with your health care provider.   Document Released: 10/22/2005 Document Revised: 03/08/2015 Document Reviewed: 10/19/2014 Elsevier Interactive Patient Education 2016 [place] [facility]  [place] [facility] of Health and [place] [email or web address] [phone])  [facility] [phone])  [place] [phone])  [place] [phone])  [facility] [phone]) This clinic caters to the indigent population and is on a lottery system. Location: [employer] [facility], [place], [place], [place] Clinic Hours: Wednesdays from 6pm - 9pm, patients seen by a lottery system. For dates, call or go to [email or web address] Services: Cleanings, fillings and simple extractions. Payment Options: DENTAL WORK IS FREE OF CHARGE. Bring proof of income or support. Best way to get seen: Arrive at 5:15 pm - this is a lottery, NOT first come/first serve, so arriving earlier will not increase your chances of being seen.     [facility] [phone] Select option 1 for emergencies   Location: [facility], [place], [address] [place]  Clinic Hours: No walk-ins accepted - call the day before to schedule an appointment. Check in times are 9:30 am and 1:30 pm. Services: Simple extractions, temporary fillings, pulpectomy/pulp debridement, uncomplicated abscess drainage. Payment Options: PAYMENT IS  DUE AT THE TIME OF SERVICE.  Fee is usually $100-200, additional surgical procedures (e.g. abscess drainage) may be extra. Cash, checks, Visa/MasterCard accepted.  Can file Medicaid if patient is covered for dental - patient should call case worker to check. No discount for [facility] patients. Best way to get seen: MUST call the day before and get onto the schedule. Can usually be seen the next 1-2 days. No walk-ins accepted.     [place] [phone]   Location: [place], [place] Clinic Hours: M, W, Th, F 8am or 1:30pm, Tues 9a or 1:30 - first come/first served. Services: Simple extractions, temporary fillings, uncomplicated abscess drainage.  You do not need to be an [facility] resident. Payment Options: PAYMENT IS DUE AT THE TIME OF SERVICE. Dental insurance, otherwise sliding scale - bring proof of income or support. Depending on income and treatment needed, cost is usually $50-200. Best way to get seen: Arrive early as it is first come/first served.     [facility] [phone]   Location: [place] [facility] Hours: Mon-Thu 8a-5p Services: Most basic dental services including extractions and fillings. Payment Options: PAYMENT IS DUE AT THE TIME OF SERVICE. Sliding scale, up to 50% off - bring proof if income or support. Medicaid with dental option accepted. Best way to get seen: Call to schedule an appointment, can usually be seen within 2 weeks OR they will try to see walk-ins - show up at [place] or 2p (you may have to wait).     [facility] RESIDENTS ONLY   Location: [facility], [place] [address] [place], [place] [address] Clinic Hours: By appointment only. Monday - Thursday 8am-5pm, Friday 8am-12pm Services: Cleanings, fillings, extractions. Payment Options: PAYMENT IS DUE AT THE TIME OF  SERVICE. Cash, Visa or MasterCard. Sliding scale - $30 minimum per service. Best way to get seen: Come in to office, complete packet and make an appointment - need proof of income or support monies for each household member and proof of [facility] residence. Usually takes about a month to get in.     [facility] [phone]   Location: [address], [place] Clinic Hours: Walk-in Urgent Care Dental Services are offered Monday-Friday mornings only. The numbers of emergencies accepted daily is limited to the number of providers available. Maximum 15 - Mondays, Wednesdays & Thursdays Maximum 10 - Tuesdays & Fridays Services: You do not need to be a [facility] resident to be seen for a dental emergency. Emergencies are defined as pain, swelling, abnormal bleeding, or dental trauma. Walkins will receive x-rays if needed. NOTE: Dental cleaning is not an emergency. Payment Options: PAYMENT IS DUE AT THE TIME OF SERVICE. Minimum co-pay is $40.00 for uninsured patients. Minimum co-pay is $3.00 for Medicaid with dental coverage. Dental Insurance is accepted and must be presented at time of visit. Medicare does not cover dental. Forms of payment: Cash, credit card, checks. Best way to get seen: If not previously registered with the clinic, walk-in dental registration begins at 7:15 am and is on a first come/first serve basis. If previously registered with the clinic, call to make an appointment.  [facility] [place] ONLY   Location: [address], [place], [place] Clinic Hours: Mon-Thu 10a-2p Services: Extractions only! Payment Options: FREE (donations accepted) - bring proof of income or support Best way to get seen: Call and schedule an appointment OR come at 8am on the 1st Monday of every month (except for holidays) when it is first come/first served.     [facility] [phone]    Location: [place], [place] Clinic Hours: Friday mornings Services, Payment Options, Best way to get seen: Call for info

## 2015-12-12 ENCOUNTER — Telehealth: Payer: Self-pay | Admitting: Gastroenterology

## 2015-12-12 NOTE — Telephone Encounter (Signed)
Patient finally got [facility] at [facility] to see a GI dr. Deborha Kerr told her that she needs a referral to see the GI doctor there from Dr. Allen Norris

## 2015-12-15 NOTE — Telephone Encounter (Signed)
Angie, could you please refer her to the [place] clinic. I thinks she was a previous [facility] pt so all her stuff may be in [employer]. Thank you so much!

## 2015-12-21 NOTE — Telephone Encounter (Signed)
Referral has been sent to [facility] Crohn's Disease. All information has been faxed to [phone]. [facility] will call patient with appointment once clinical notes have been reviewed.

## 2015-12-23 NOTE — Telephone Encounter (Signed)
Pt notified referral has been done.

## 2016-02-21 ENCOUNTER — Telehealth: Payer: Self-pay | Admitting: Gastroenterology

## 2016-02-21 NOTE — Telephone Encounter (Signed)
Needs new referral to go to [facility]. Had to cancel last appointment due to car issues.

## 2016-02-28 NOTE — Telephone Encounter (Signed)
Thank you :)

## 2016-02-28 NOTE — Telephone Encounter (Signed)
I have faxed a new referral form and clinic notes to [place] [facility] @ [phone]. I have also called patient and left a message on voicemail to make patient aware that i have sent a new referral and that [facility] should call the patient within 2-5 business days. If she has not received a call from [facility] in a timely manner, to please call me back.

## 2016-02-28 NOTE — Telephone Encounter (Signed)
Can you resend referral to [facility]. She was originally sent for Crohn's disease. Thank you!

## 2018-07-22 ENCOUNTER — Other Ambulatory Visit: Payer: Self-pay | Admitting: Gastroenterology

## 2018-07-22 DIAGNOSIS — K50119 Crohn's disease of large intestine with unspecified complications: Secondary | ICD-10-CM

## 2018-07-31 ENCOUNTER — Ambulatory Visit
Admission: RE | Admit: 2018-07-31 | Discharge: 2018-07-31 | Disposition: A | Discharge status: Home / Self Care | Payer: Medicaid Other | Source: Ambulatory Visit | Attending: Gastroenterology | Admitting: Gastroenterology

## 2018-07-31 DIAGNOSIS — K50119 Crohn's disease of large intestine with unspecified complications: Secondary | ICD-10-CM | POA: Insufficient documentation

## 2018-07-31 DIAGNOSIS — K56609 Unspecified intestinal obstruction, unspecified as to partial versus complete obstruction: Secondary | ICD-10-CM

## 2018-07-31 DIAGNOSIS — R109 Unspecified abdominal pain: Secondary | ICD-10-CM | POA: Insufficient documentation

## 2018-07-31 MED ORDER — IOPAMIDOL (ISOVUE-300) INJECTION 61%
100.0000 mL | Freq: Once | INTRAVENOUS | Status: AC | PRN
  Administered 2018-07-31: 100 mL via INTRAVENOUS

## 2018-08-01 ENCOUNTER — Other Ambulatory Visit: Payer: Self-pay

## 2018-08-01 ENCOUNTER — Inpatient Hospital Stay
Admission: EM | Admit: 2018-08-01 | Discharge: 2018-08-03 | DRG: 392 | Disposition: A | Discharge status: Home / Self Care | Payer: Medicaid Other | Attending: Internal Medicine | Admitting: Internal Medicine

## 2018-08-01 DIAGNOSIS — Z5329 Procedure and treatment not carried out because of patient's decision for other reasons: Secondary | ICD-10-CM | POA: Diagnosis not present

## 2018-08-01 DIAGNOSIS — K50112 Crohn's disease of large intestine with intestinal obstruction: Secondary | ICD-10-CM

## 2018-08-01 DIAGNOSIS — Z79899 Other long term (current) drug therapy: Secondary | ICD-10-CM

## 2018-08-01 DIAGNOSIS — K50812 Crohn's disease of both small and large intestine with intestinal obstruction: Secondary | ICD-10-CM | POA: Diagnosis present

## 2018-08-01 DIAGNOSIS — K567 Ileus, unspecified: Secondary | ICD-10-CM | POA: Diagnosis present

## 2018-08-01 DIAGNOSIS — F17210 Nicotine dependence, cigarettes, uncomplicated: Secondary | ICD-10-CM | POA: Diagnosis present

## 2018-08-01 DIAGNOSIS — A0811 Acute gastroenteropathy due to Norwalk agent: Secondary | ICD-10-CM | POA: Diagnosis present

## 2018-08-01 DIAGNOSIS — R109 Unspecified abdominal pain: Secondary | ICD-10-CM

## 2018-08-01 DIAGNOSIS — J449 Chronic obstructive pulmonary disease, unspecified: Secondary | ICD-10-CM | POA: Diagnosis present

## 2018-08-01 DIAGNOSIS — Z9049 Acquired absence of other specified parts of digestive tract: Secondary | ICD-10-CM | POA: Diagnosis not present

## 2018-08-01 DIAGNOSIS — K5090 Crohn's disease, unspecified, without complications: Secondary | ICD-10-CM | POA: Diagnosis present

## 2018-08-01 LAB — URINALYSIS, COMPLETE (UACMP) WITH MICROSCOPIC
BILIRUBIN URINE: NEGATIVE
Glucose, UA: NEGATIVE mg/dL
KETONES UR: 20 mg/dL — AB
LEUKOCYTES UA: NEGATIVE
Nitrite: POSITIVE — AB
PROTEIN: 30 mg/dL — AB
Specific Gravity, Urine: 1.028 (ref 1.005–1.030)
pH: 5 (ref 5.0–8.0)

## 2018-08-01 LAB — COMPREHENSIVE METABOLIC PANEL
ALT: 11 U/L (ref 0–44)
ANION GAP: 7 (ref 5–15)
AST: 15 U/L (ref 15–41)
Albumin: 3.2 g/dL — ABNORMAL LOW (ref 3.5–5.0)
Alkaline Phosphatase: 87 U/L (ref 38–126)
BUN: 17 mg/dL (ref 6–20)
CHLORIDE: 107 mmol/L (ref 98–111)
CO2: 27 mmol/L (ref 22–32)
Calcium: 8.4 mg/dL — ABNORMAL LOW (ref 8.9–10.3)
Creatinine, Ser: 0.6 mg/dL (ref 0.44–1.00)
GFR calc Af Amer: >60 mL/min (ref >60)
GFR calc non Af Amer: >60 mL/min (ref >60)
Glucose, Bld: 97 mg/dL (ref 70–99)
POTASSIUM: 4 mmol/L (ref 3.5–5.1)
SODIUM: 141 mmol/L (ref 135–145)
TOTAL PROTEIN: 6.1 g/dL — AB (ref 6.5–8.1)
Total Bilirubin: 0.7 mg/dL (ref 0.3–1.2)

## 2018-08-01 LAB — CBC
HEMATOCRIT: 46.1 % (ref 35.0–47.0)
Hemoglobin: 15.7 g/dL (ref 12.0–16.0)
MCH: 29.5 pg (ref 26.0–34.0)
MCHC: 34.2 g/dL (ref 32.0–36.0)
MCV: 86.2 fL (ref 80.0–100.0)
Platelets: 388 10*3/uL (ref 150–440)
RBC: 5.34 MIL/uL — AB (ref 3.80–5.20)
RDW: 14.4 % (ref 11.5–14.5)
WBC: 14.4 10*3/uL — ABNORMAL HIGH (ref 3.6–11.0)

## 2018-08-01 LAB — LIPASE, BLOOD: Lipase: 30 U/L (ref 11–51)

## 2018-08-01 LAB — LACTIC ACID, PLASMA: Lactic Acid, Venous: 0.8 mmol/L (ref 0.5–1.9)

## 2018-08-01 MED ORDER — SODIUM CHLORIDE 0.9 % IV SOLN
INTRAVENOUS | Status: AC
  Administered 2018-08-01 – 2018-08-02 (×2): via INTRAVENOUS

## 2018-08-01 MED ORDER — MORPHINE SULFATE (PF) 2 MG/ML IV SOLN: 2.0000 mg | INTRAVENOUS | Status: DC | PRN

## 2018-08-01 MED ORDER — METRONIDAZOLE IN NACL 5-0.79 MG/ML-% IV SOLN
500.0000 mg | Freq: Once | INTRAVENOUS | Status: AC
  Administered 2018-08-01: 500 mg via INTRAVENOUS
  Filled 2018-08-01: qty 100

## 2018-08-01 MED ORDER — METRONIDAZOLE IN NACL 5-0.79 MG/ML-% IV SOLN
500.0000 mg | Freq: Three times a day (TID) | INTRAVENOUS | Status: DC
  Administered 2018-08-01 – 2018-08-02 (×3): 500 mg via INTRAVENOUS
  Filled 2018-08-01 (×6): qty 100

## 2018-08-01 MED ORDER — BISACODYL 5 MG PO TBEC: 5.0000 mg | DELAYED_RELEASE_TABLET | Freq: Every day | ORAL | Status: DC | PRN

## 2018-08-01 MED ORDER — HYDROCODONE-ACETAMINOPHEN 5-325 MG PO TABS
1.0000 | ORAL_TABLET | ORAL | Status: DC | PRN
  Administered 2018-08-02 (×2): 1 via ORAL
  Filled 2018-08-01: qty 1
  Filled 2018-08-01: qty 2
  Filled 2018-08-01: qty 1

## 2018-08-01 MED ORDER — HYDROCORTISONE NA SUCCINATE PF 100 MG IJ SOLR: 100.0000 mg | Freq: Once | INTRAMUSCULAR | Status: DC

## 2018-08-01 MED ORDER — FAMOTIDINE IN NACL 20-0.9 MG/50ML-% IV SOLN
20.0000 mg | Freq: Two times a day (BID) | INTRAVENOUS | Status: DC
  Administered 2018-08-01 – 2018-08-02 (×3): 20 mg via INTRAVENOUS
  Filled 2018-08-01 (×3): qty 50

## 2018-08-01 MED ORDER — CIPROFLOXACIN IN D5W 400 MG/200ML IV SOLN
400.0000 mg | Freq: Once | INTRAVENOUS | Status: AC
  Administered 2018-08-01: 400 mg via INTRAVENOUS
  Filled 2018-08-01: qty 200

## 2018-08-01 MED ORDER — ONDANSETRON HCL 4 MG/2ML IJ SOLN: 4.0000 mg | Freq: Four times a day (QID) | INTRAMUSCULAR | Status: DC | PRN

## 2018-08-01 MED ORDER — ONDANSETRON 4 MG PO TBDP
4.0000 mg | ORAL_TABLET | Freq: Once | ORAL | Status: AC | PRN
  Administered 2018-08-01: 4 mg via ORAL
  Filled 2018-08-01: qty 1

## 2018-08-01 MED ORDER — HYDROCORTISONE NA SUCCINATE PF 100 MG IJ SOLR
100.0000 mg | Freq: Three times a day (TID) | INTRAMUSCULAR | Status: DC
  Administered 2018-08-01 – 2018-08-02 (×3): 100 mg via INTRAVENOUS
  Filled 2018-08-01 (×3): qty 2

## 2018-08-01 MED ORDER — DOCUSATE SODIUM 100 MG PO CAPS: 100.0000 mg | ORAL_CAPSULE | Freq: Two times a day (BID) | ORAL | Status: DC

## 2018-08-01 MED ORDER — ONDANSETRON HCL 4 MG PO TABS: 4.0000 mg | ORAL_TABLET | Freq: Four times a day (QID) | ORAL | Status: DC | PRN

## 2018-08-01 MED ORDER — HEPARIN SODIUM (PORCINE) 5000 UNIT/ML IJ SOLN
5000.0000 [IU] | Freq: Three times a day (TID) | INTRAMUSCULAR | Status: DC
  Administered 2018-08-01: 5000 [IU] via SUBCUTANEOUS
  Filled 2018-08-01 (×4): qty 1

## 2018-08-01 MED ORDER — CIPROFLOXACIN IN D5W 400 MG/200ML IV SOLN
400.0000 mg | Freq: Two times a day (BID) | INTRAVENOUS | Status: DC
  Administered 2018-08-02 (×2): 400 mg via INTRAVENOUS
  Filled 2018-08-01 (×4): qty 200

## 2018-08-01 MED ORDER — ACETAMINOPHEN 325 MG PO TABS: 650.0000 mg | ORAL_TABLET | Freq: Four times a day (QID) | ORAL | Status: DC | PRN

## 2018-08-01 MED ORDER — TRAZODONE HCL 50 MG PO TABS: 25.0000 mg | ORAL_TABLET | Freq: Every evening | ORAL | Status: DC | PRN

## 2018-08-01 MED ORDER — ALBUTEROL SULFATE (2.5 MG/3ML) 0.083% IN NEBU: 2.5000 mg | INHALATION_SOLUTION | RESPIRATORY_TRACT | Status: DC | PRN

## 2018-08-01 MED ORDER — SODIUM CHLORIDE 0.9 % IV BOLUS
1000.0000 mL | Freq: Once | INTRAVENOUS | Status: AC
  Administered 2018-08-01: 1000 mL via INTRAVENOUS

## 2018-08-01 MED ORDER — ACETAMINOPHEN 650 MG RE SUPP: 650.0000 mg | Freq: Four times a day (QID) | RECTAL | Status: DC | PRN

## 2018-08-01 MED ORDER — MORPHINE SULFATE (PF) 4 MG/ML IV SOLN
INTRAVENOUS | Status: AC
  Administered 2018-08-01: 4 mg
  Filled 2018-08-01: qty 1

## 2018-08-01 NOTE — H&P (Addendum)
[facility] [place] NAME: [name]    MR#:  [id number]  DATE OF BIRTH:  [birth year]  DATE OF ADMISSION:  08/01/2018  PRIMARY CARE PHYSICIAN: Lorelee Market, MD   REQUESTING/REFERRING PHYSICIAN: Dr. Clearnce Hasten  CHIEF COMPLAINT: Abdominal pain   Chief Complaint  Patient presents with  . Nausea  . Emesis    HISTORY OF PRESENT ILLNESS:  [name]  is a 52 y.o. female with a known history of Crohn's disease since 2016 was on Asacol, Humira before but not on any medicines now has been having abdominal pain, bloating, nausea, vomiting since yesterday.  Patient saw gastroenterology Dr. Gustavo Lah had a CAT scan of abdomen which showed Crohn's flare involving small bowel, rectosigmoid junction.  Patient has abdominal pain all over her abdomen 10/10 in severity associated with nausea, vomiting and subjective fever.  Patient CT abdomen concerning for small bowel obstruction, seen by surgeon Dr. Lysle Pearl did not recommend any surgical intervention, gastroenterology Dr. Vicente Males is contacted by ER physician who recommended to continue IV antibiotics, NG tube decompression, IV fluids did not recommend steroids at this time.  Patient is refusing NG tube decompression.  PAST MEDICAL HISTORY:   Past Medical History:  Diagnosis Date  . COPD (chronic obstructive pulmonary disease) ([place])   . Crohn's disease ([place])     PAST SURGICAL HISTOIRY:   Past Surgical History:  Procedure Laterality Date  . ABDOMINAL HYSTERECTOMY    . CHOLECYSTECTOMY    . EYE SURGERY      SOCIAL HISTORY:   Social History   Tobacco Use  . Smoking status: Current Every Day Smoker  . Smokeless tobacco: Never Used  Substance Use Topics  . Alcohol use: No    FAMILY HISTORY:  History reviewed. No pertinent family history.  DRUG ALLERGIES:  No Known Allergies  REVIEW OF SYSTEMS:  CONSTITUTIONAL: No fever, fatigue or weakness.  EYES: No blurred or double vision.   EARS, NOSE, AND THROAT: No tinnitus or ear pain.  RESPIRATORY: No cough, shortness of breath, wheezing or hemoptysis.  CARDIOVASCULAR: No chest pain, orthopnea, edema.  GASTROINTESTINAL: Generalized abdominal tenderness,  GENITOURINARY: No dysuria, hematuria.  ENDOCRINE: No polyuria, nocturia,  HEMATOLOGY: No anemia, easy bruising or bleeding SKIN: No rash or lesion. MUSCULOSKELETAL: No joint pain or arthritis.   NEUROLOGIC: No tingling, numbness, weakness.  PSYCHIATRY: No anxiety or depression.   MEDICATIONS AT HOME:   Prior to Admission medications   Medication Sig Start Date End Date Taking? Authorizing Provider  albuterol (PROVENTIL HFA;VENTOLIN HFA) 108 (90 Base) MCG/ACT inhaler Inhale 2 puffs into the lungs every 4 (four) hours as needed for wheezing or shortness of breath.   Yes [provider]  dicyclomine (BENTYL) 10 MG capsule Take 10 mg by mouth 3 (three) times daily as needed for cramping. 07/21/18 07/21/19 Yes [provider]  FLUoxetine (PROZAC) 40 MG capsule Take 40 mg by mouth daily.   Yes [provider]  Fluticasone Propionate, Inhal, (FLOVENT IN) Inhale 2 puffs into the lungs daily.   Yes [provider]  hydrOXYzine (ATARAX/VISTARIL) 25 MG tablet Take 25 mg by mouth at bedtime.   Yes [provider]  pantoprazole (PROTONIX) 40 MG tablet Take 40 mg by mouth daily. 07/21/18  Yes [provider]  amoxicillin (AMOXIL) 500 MG tablet Take 1 tablet (500 mg total) by mouth 2 (two) times daily. Patient not taking: Reported on 08/01/2018 11/26/15   Arlyss Repress, PA-C  HYDROcodone-acetaminophen [facility])  5-325 MG tablet Take 1-2 tablets by mouth every 4 (four) hours as needed for moderate pain. Patient not taking: Reported on 08/01/2018 11/26/15   Arlyss Repress, PA-C  ibuprofen (ADVIL,MOTRIN) 800 MG tablet Take 1 tablet (800 mg total) by mouth every 8 (eight) hours as needed. Patient not taking: Reported on 08/01/2018 11/26/15    Arlyss Repress, PA-C  lidocaine (XYLOCAINE) 2 % solution Use as directed 20 mLs in the mouth or throat as needed for mouth pain. Patient not taking: Reported on 08/01/2018 11/26/15   Arlyss Repress, PA-C      VITAL SIGNS:  Blood pressure 109/75, pulse 75, temperature 97.6 F (36.4 C), temperature source Oral, resp. rate 14, height 5' (1.524 m), weight 68.5 kg, SpO2 97 %.  PHYSICAL EXAMINATION:  GENERAL:  52 y.o.-year-old patient lying in the bed with no acute distress.  EYES: Pupils equal, round, reactive to light and accommodation. No scleral icterus. Extraocular muscles intact.  HEENT: Head atraumatic, normocephalic. Oropharynx and nasopharynx clear.  NECK:  Supple, no jugular venous distention. No thyroid enlargement, no tenderness.  LUNGS: Normal breath sounds bilaterally,faint wheezing.bilaterally CARDIOVASCULAR: S1, S2 normal. No murmurs, rubs, or gallops.  ABDOMEN: Soft, nondistended.  Focal tenderness in the right side of umbilicus.  No rebound tenderness in any other quadrants.  EXTREMITIES: No pedal edema, cyanosis, or clubbing.  NEUROLOGIC: Cranial nerves II through XII are intact. Muscle strength 5/5 in all extremities. Sensation intact. Gait not checked.  PSYCHIATRIC: The patient is alert and oriented x 3.  SKIN: No obvious rash, lesion, or ulcer.   LABORATORY PANEL:   CBC Recent Labs  Lab 08/01/18 0938  WBC 14.4*  HGB 15.7  HCT 46.1  PLT 388   ------------------------------------------------------------------------------------------------------------------  Chemistries  No results for input(s): NA, K, CL, CO2, GLUCOSE, BUN, CREATININE, CALCIUM, MG, AST, ALT, ALKPHOS, BILITOT in the last 168 hours.  Invalid input(s): GFRCGP ------------------------------------------------------------------------------------------------------------------  Cardiac Enzymes No results for input(s): TROPONINI in the last 168  hours. ------------------------------------------------------------------------------------------------------------------  RADIOLOGY:  Ct Abdomen Pelvis W Contrast  Result Date: 07/31/2018 CLINICAL DATA:  Abdominal bloating for 3 months. History of Crohn's disease EXAM: CT ABDOMEN AND PELVIS WITH CONTRAST TECHNIQUE: Multidetector CT imaging of the abdomen and pelvis was performed using the standard protocol following bolus administration of intravenous contrast. CONTRAST:  175m ISOVUE-300 IOPAMIDOL (ISOVUE-300) INJECTION 61% COMPARISON:  February 16, 2015 FINDINGS: Lower chest: Mild patchy consolidation of the right lower lobe is identified suspicious for developing pneumonia. The left lung is clear. The heart size is normal. Hepatobiliary: Calcified granulomas identified throughout liver. No other focal liver abnormality is seen. Status post cholecystectomy. No biliary dilatation. Pancreas: Unremarkable. No pancreatic ductal dilatation or surrounding inflammatory changes. Spleen: Normal in size without focal abnormality. Calcified granulomas are identified in the spleen. Adrenals/Urinary Tract: The bilateral adrenal glands are normal. There is a tiny cyst in the left kidney. The right kidney is normal. There is no hydronephrosis bilaterally. The bladder is normal. Stomach/Bowel: There are thick walled dilated small bowel loops in the right lower quadrant with mild-to-moderate dilatation of the more proximal and mid small bowel loops. There is probable thick walled rectal sigmoid colon with mild surrounding stranding. The stomach is normal. Vascular/Lymphatic: Aortic atherosclerosis. No enlarged abdominal or pelvic lymph nodes. Reproductive: Status post hysterectomy. No adnexal masses. Other: None. Musculoskeletal: No acute abnormality noted. IMPRESSION: Findings consistent with Crohn's disease with areas of involvement including distal small bowel loops and possibly rectal sigmoid colon. There is a functional  small-bowel obstruction of the mid and proximal small bowel loops due to inflammatory changes of distal small bowel loops. Electronically Signed   By: Abelardo Diesel M.D.   On: 07/31/2018 14:47    EKG:   Orders placed or performed during the hospital encounter of 08/01/18  . ED EKG 12-Lead  . ED EKG 12-Lead  . EKG 12-Lead  . EKG 12-Lead    IMPRESSION AND PLAN:  52 year old female patient with history of Crohn's disease since 2016 not on any anti-inflammatories comes in because of abdominal pain, nausea, vomiting, CT abdomen concerning for Crohn's flare, small bowel obstruction: 1.  Acute abdominal pain, nausea, vomiting secondary to Crohn's flare, small bowel obstruction: Continue n.p.o., started on IV antibiotics, IV fluids, conservative management advised to surgery, patient will be seen by gastroenterology, supposed to get colonoscopy but because her abdominal pain got severe she came to hospital.  Consult gastroenterology. 2.  COPD;, tobacco abuse patient smokes about 1 pack of cigarettes a day for almost 30 years.  Advised to quit smoking, smoking cessation counseling done for 15 minutes, continue albuterol inhaler. #3. nausea, vomiting: Use IV nausea medicines GI, DVT prophylaxis.  All the records are reviewed and case discussed with ED provider. Management plans discussed with the patient, family and they are in agreement.  CODE STATUS:full TOTAL TIME TAKING CARE OF THIS PATIENT: 54mnutes.    SEpifanio LeschesM.D on 08/01/2018 at 4:10 PM  Between 7am to 6pm - Pager - [phone]  After 6pm go to [email or web address] - password [email or web address]  Office  [phone] CC: Primary care physician; NLorelee Market MD  Note: This dictation was prepared with Dragon dictation along with smaller phrase technology. Any transcriptional errors that result from this process are unintentional.

## 2018-08-01 NOTE — ED Notes (Signed)
Pt up to toilet to attempt urine collection

## 2018-08-01 NOTE — ED Notes (Signed)
First Nurse Note: Patient complaining of abdominal pain, states she had a CT scan yesterday and now has pain and vomiting.  Alert and oriented.  Ambulatory.

## 2018-08-01 NOTE — Progress Notes (Signed)
CODE SEPSIS - PHARMACY COMMUNICATION  **Broad Spectrum Antibiotics should be administered within 1 hour of Sepsis diagnosis**  Time Code Sepsis Called/Page Received: 1242  Antibiotics Ordered: cipro and flagyl x1  Time of 1st antibiotic administration: 1242  Additional action taken by pharmacy: none  If necessary, Name of Provider/Nurse Contacted: none    Rocky Morel ,PharmD Clinical Pharmacist  08/01/2018  12:54 PM

## 2018-08-01 NOTE — ED Notes (Signed)
Pt given urine specimen cup.

## 2018-08-01 NOTE — ED Notes (Signed)
Attempted to call report per Secretary RN in room at this time. Please call back in 5 minutes.

## 2018-08-01 NOTE — ED Notes (Signed)
Pt informed of needing urine specimen when able

## 2018-08-01 NOTE — ED Provider Notes (Addendum)
[facility] Emergency Department Provider Note  ___________________________________________   First MD Initiated Contact with Patient 08/01/18 1217     (approximate)  I have reviewed the triage vital signs and the nursing notes.   HISTORY  Chief Complaint Nausea and Emesis   HPI [name] is a 52 y.o. female with a history of Crohn's disease not on any anti-inflammatory medication at this time was presented to the emergency department with nausea, vomiting and diarrhea as well as upper abdominal distention.  She says that the symptoms started yesterday after CAT scan.  Says that she just saw a PA and a gastroenterologist office on 16 September.  The CAT scan was ordered and she is also scheduled for colonoscopy.  She received a CAT scan yesterday and then started having nausea, vomiting, pain and diarrhea thereafter.  Pain is cramping and constant into the upper abdomen.  Says that she is last vomited just prior to being triaged here today.  Denies any blood in her stool or vomitus.  Past Medical History:  Diagnosis Date  . COPD (chronic obstructive pulmonary disease) ([place])   . Crohn's disease ([place])     There are no active problems to display for this patient.   Past Surgical History:  Procedure Laterality Date  . ABDOMINAL HYSTERECTOMY    . CHOLECYSTECTOMY    . EYE SURGERY      Prior to Admission medications   Medication Sig Start Date End Date Taking? Authorizing Provider  amoxicillin (AMOXIL) 500 MG tablet Take 1 tablet (500 mg total) by mouth 2 (two) times daily. 11/26/15   Beers, Pierce Crane, PA-C  HYDROcodone-acetaminophen (NORCO) 5-325 MG tablet Take 1-2 tablets by mouth every 4 (four) hours as needed for moderate pain. 11/26/15   Beers, Pierce Crane, PA-C  ibuprofen (ADVIL,MOTRIN) 800 MG tablet Take 1 tablet (800 mg total) by mouth every 8 (eight) hours as needed. 11/26/15   Beers, Pierce Crane, PA-C  lidocaine (XYLOCAINE) 2 % solution Use as  directed 20 mLs in the mouth or throat as needed for mouth pain. 11/26/15   Beers, Pierce Crane, PA-C    Allergies Patient has no known allergies.  History reviewed. No pertinent family history.  Social History Social History   Tobacco Use  . Smoking status: Current Every Day Smoker  . Smokeless tobacco: Never Used  Substance Use Topics  . Alcohol use: No  . Drug use: No    Review of Systems  Constitutional: No fever/chills Eyes: No visual changes. ENT: No sore throat. Cardiovascular: Denies chest pain. Respiratory: Denies shortness of breath. Gastrointestinal:   No constipation. Genitourinary: Negative for dysuria. Musculoskeletal: Negative for back pain. Skin: Negative for rash. Neurological: Negative for headaches, focal weakness or numbness.   ____________________________________________   PHYSICAL EXAM:  VITAL SIGNS: ED Triage Vitals  Enc Vitals Group     BP 08/01/18 0915 112/75     Pulse Rate 08/01/18 0915 (!) 107     Resp 08/01/18 1230 18     Temp 08/01/18 0915 97.6 F (36.4 C)     Temp Source 08/01/18 0915 Oral     SpO2 08/01/18 0915 100 %     Weight 08/01/18 0916 151 lb (68.5 kg)     Height 08/01/18 0916 5' (1.524 m)     Head Circumference --      Peak Flow --      Pain Score 08/01/18 0922 10     Pain Loc --  Pain Edu? --      Excl. in [place]? --     Constitutional: Alert and oriented.  No acute distress.  Not actively vomiting in the room. Eyes: Conjunctivae are normal.  Head: Atraumatic. Nose: No congestion/rhinnorhea. Mouth/Throat: Mucous membranes are moist.  Neck: No stridor.   Cardiovascular: Tachycardic, regular rhythm. Grossly normal heart sounds.   Respiratory: Normal respiratory effort.  No retractions. Lungs CTAB. Gastrointestinal: Soft with mild upper abdominal tenderness with mild distention. Musculoskeletal: No lower extremity tenderness nor edema.  No joint effusions. Neurologic:  Normal speech and language. No gross focal  neurologic deficits are appreciated. Skin:  Skin is warm, dry and intact. No rash noted. Psychiatric: Mood and affect are normal. Speech and behavior are normal.  ____________________________________________   LABS (all labs ordered are listed, but only abnormal results are displayed)  Labs Reviewed  CBC - Abnormal; Notable for the following components:      Result Value   WBC 14.4 (*)    RBC 5.34 (*)    All other components within normal limits  URINALYSIS, COMPLETE (UACMP) WITH MICROSCOPIC  COMPREHENSIVE METABOLIC PANEL  LIPASE, BLOOD   ____________________________________________  EKG  ED ECG REPORT I, Doran Stabler, the attending physician, personally viewed and interpreted this ECG.   Date: 08/01/2018  EKG Time: 1257  Rate: 78  Rhythm: normal sinus rhythm  Axis: Normal  Intervals:none  ST&T Change: No ST segment elevation or depression.  No abnormal T wave inversion.  ____________________________________________  RADIOLOGY  CAT scan reviewed from yesterday which shows likely Crohn's flare with obstruction. ____________________________________________   PROCEDURES  Procedure(s) performed:   .Critical Care Performed by: Orbie Pyo, MD Authorized by: Orbie Pyo, MD   Critical care provider statement:    Critical care time (minutes):  35   Critical care time was exclusive of:  Separately billable procedures and treating other patients   Critical care was necessary to treat or prevent imminent or life-threatening deterioration of the following conditions:  Sepsis   Critical care was time spent personally by me on the following activities:  Development of treatment plan with patient or surrogate, discussions with consultants, evaluation of patient's response to treatment, examination of patient, obtaining history from patient or surrogate, ordering and performing treatments and interventions, ordering and review of laboratory  studies, ordering and review of radiographic studies, pulse oximetry, re-evaluation of patient's condition and review of old charts    Critical Care performed:   ____________________________________________   INITIAL IMPRESSION / [place] / ED COURSE  Pertinent labs & imaging results that were available during my care of the patient were reviewed by me and considered in my medical decision making (see chart for details).  Differential diagnosis includes, but is not limited to, biliary disease (biliary colic, acute cholecystitis, cholangitis, choledocholithiasis, etc), intrathoracic causes for epigastric abdominal pain including ACS, gastritis, duodenitis, pancreatitis, small bowel or large bowel obstruction, abdominal aortic aneurysm, hernia, and ulcer(s). As part of my medical decision making, I reviewed the following data within the electronic MEDICAL RECORD NUMBER Notes from prior ED visits as well as recent GI visit  ----------------------------------------- 12:44 PM on 08/01/2018 -----------------------------------------  Patient with findings of Crohn's flare as well as small bowel obstruction on the CAT scan of the abdomen and pelvis.  Discussed the case with Dr. Vicente Males who recommends an NG tube.  Says to hold steroids but use Cipro and Flagyl.  Recommend surgical admission.  Discussed case with Dr. Lysle Pearl of general surgery  who says that he will be evaluating the patient.  Patient aware of the need for NG tube. ____________________________________________   FINAL CLINICAL IMPRESSION(S) / ED DIAGNOSES  Crohn's flare.  Small bowel obstruction.  NEW MEDICATIONS STARTED DURING THIS VISIT:  New Prescriptions   No medications on file     Note:  This document was prepared using Dragon voice recognition software and may include unintentional dictation errors.     Orbie Pyo, MD 08/01/18 1244    Babbette Dalesandro, Randall An, MD 08/01/18 1359  Patient to be  admitted to primarily to medicine.  Signed out to Dr. Vianne Bulls.    Orbie Pyo, MD 08/01/18 [phone]

## 2018-08-01 NOTE — ED Notes (Signed)
[name] aware of need for redraw of lab.

## 2018-08-01 NOTE — ED Notes (Signed)
Lab called for assistance for recollect.

## 2018-08-01 NOTE — ED Triage Notes (Signed)
Boated and hurting, ct yesterday and drank contrast. Since drinking contrast pt has been experiencing N/V/D. Pt has been unable to keep any fluids down. Pt unable to give number of occurrences states uncountable. Pt ambulatory to triage with NAD noted at this time but states unsteady on feet last night. Pt pulse 103

## 2018-08-01 NOTE — ED Notes (Signed)
Lab called [facility] requesting green top tube to be redrawn.

## 2018-08-01 NOTE — Consult Note (Addendum)
Jonathon Bellows , MD [address], [place], [place], [place], [address] [address] [place], [place], [place], [address] Phone: [phone]  Fax: [phone]  Consultation  Referring Provider:   ER  Primary Care Physician:  Lorelee Market, MD Primary Gastroenterologist: [facility]          Reason for Consultation:     Crohns disease   Date of Admission:  08/01/2018 Date of Consultation:  08/01/2018         HPI:   [name] is a 52 y.o. female diagnosed in 2008 with Crohn's disease.  She is previously been seen by Dr. Domenica Fail at [facility].  Last seen by [facility] at our hospital.  She was commenced on Humira in 2015 for over 18 months without any improvement in symptoms.  Colonoscopy in 2015 showed severe stenosis of the IC valve scarred mucosa in the entire colon.  All medications including Humira stopped in October 2016 due to issues with insurance.  She has been on 5-ASA compounds in the past.  She was seen on 07/21/2018 at [facility] clinic and the plan was to perform a CT scan of the abdomen, EGD, colonoscopy and lab work.  She presents to the emergency room today with abdominal pain, nausea vomiting since yesterday.  She had a CT scan of the abdomen yesterday and showed features of active Crohn's disease with areas of involvement including distal small bowel loops and possibly the rectal sigmoid colon.  Functional small bowel obstruction of the mid and proximal small bowel loops due to inflammatory changes of distal small bowel loops.  She was seen by Dr. Lysle Pearl today and is no plan for any surgical intervention.  On admission hemoglobin 15.7 g with a white cell count of 14.4. She states that she has been having nausea vomiting abdominal pain for 1 day in duration.  After coming to the hospital she is feeling much better.  She states she probably had a fever yesterday.  She says that she suffers from loose stools long-term.  Denies any use of NSAIDs.  She is a smoker.  Denes any surgery for  Crohn's disease.  She has had a hysterectomy and cholecystectomy    Past Medical History:  Diagnosis Date  . COPD (chronic obstructive pulmonary disease) ([place])   . Crohn's disease [facility])     Past Surgical History:  Procedure Laterality Date  . ABDOMINAL HYSTERECTOMY    . CHOLECYSTECTOMY    . EYE SURGERY      Prior to Admission medications   Medication Sig Start Date End Date Taking? Authorizing Provider  albuterol (PROVENTIL HFA;VENTOLIN HFA) 108 (90 Base) MCG/ACT inhaler Inhale 2 puffs into the lungs every 4 (four) hours as needed for wheezing or shortness of breath.   Yes [provider]  dicyclomine (BENTYL) 10 MG capsule Take 10 mg by mouth 3 (three) times daily as needed for cramping. 07/21/18 07/21/19 Yes [provider]  FLUoxetine (PROZAC) 40 MG capsule Take 40 mg by mouth daily.   Yes [provider]  Fluticasone Propionate, Inhal, (FLOVENT IN) Inhale 2 puffs into the lungs daily.   Yes [provider]  hydrOXYzine (ATARAX/VISTARIL) 25 MG tablet Take 25 mg by mouth at bedtime.   Yes [provider]  pantoprazole (PROTONIX) 40 MG tablet Take 40 mg by mouth daily. 07/21/18  Yes [provider]  amoxicillin (AMOXIL) 500 MG tablet Take 1 tablet (500 mg total) by mouth 2 (two) times daily. Patient not taking:  Reported on 08/01/2018 11/26/15   Arlyss Repress, PA-C  HYDROcodone-acetaminophen (NORCO) 5-325 MG tablet Take 1-2 tablets by mouth every 4 (four) hours as needed for moderate pain. Patient not taking: Reported on 08/01/2018 11/26/15   Arlyss Repress, PA-C  ibuprofen (ADVIL,MOTRIN) 800 MG tablet Take 1 tablet (800 mg total) by mouth every 8 (eight) hours as needed. Patient not taking: Reported on 08/01/2018 11/26/15   Arlyss Repress, PA-C  lidocaine (XYLOCAINE) 2 % solution Use as directed 20 mLs in the mouth or throat as needed for mouth pain. Patient not taking: Reported on 08/01/2018 11/26/15   Arlyss Repress, PA-C     History reviewed. No pertinent family history.   Social History   Tobacco Use  . Smoking status: Current Every Day Smoker  . Smokeless tobacco: Never Used  Substance Use Topics  . Alcohol use: No  . Drug use: No    Allergies as of 08/01/2018  . (No Known Allergies)    Review of Systems:    All systems reviewed and negative except where noted in HPI.   Physical Exam:  Vital signs in last 24 hours: Temp:  [97.6 F (36.4 C)] 97.6 F (36.4 C) (09/27 0915) Pulse Rate:  [66-107] 76 (09/27 1600) Resp:  [13-19] 16 (09/27 1600) BP: (96-124)/(64-86) 96/64 (09/27 1600) SpO2:  [96 %-100 %] 96 % (09/27 1600) Weight:  [68.5 kg] 68.5 kg (09/27 0916)   General:   Pleasant, cooperative in NAD Head:  Normocephalic and atraumatic. Eyes:   No icterus.   Conjunctiva pink. PERRLA. Ears:  Normal auditory acuity. Neck:  Supple; no masses or thyroidomegaly Lungs: Respirations even and unlabored. Lungs clear to auscultation bilaterally.   No wheezes, crackles, or rhonchi.  Heart:  Regular rate and rhythm;  Without murmur, clicks, rubs or gallops Abdomen:  Soft, nondistended, nontender. Normal bowel sounds. No appreciable masses or hepatomegaly.  No rebound or guarding.  Neurologic:  Alert and oriented x3;  grossly normal neurologically. Skin:  Intact without significant lesions or rashes. Cervical Nodes:  No significant cervical adenopathy. Psych:  Alert and cooperative. Normal affect.  LAB RESULTS: Recent Labs    08/01/18 0938  WBC 14.4*  HGB 15.7  HCT 46.1  PLT 388   BMET No results for input(s): NA, K, CL, CO2, GLUCOSE, BUN, CREATININE, CALCIUM in the last 72 hours. LFT No results for input(s): PROT, ALBUMIN, AST, ALT, ALKPHOS, BILITOT, BILIDIR, IBILI in the last 72 hours. PT/INR No results for input(s): LABPROT, INR in the last 72 hours.  STUDIES: Ct Abdomen Pelvis W Contrast  Result Date: 07/31/2018 CLINICAL DATA:  Abdominal bloating for 3 months. History of Crohn's  disease EXAM: CT ABDOMEN AND PELVIS WITH CONTRAST TECHNIQUE: Multidetector CT imaging of the abdomen and pelvis was performed using the standard protocol following bolus administration of intravenous contrast. CONTRAST:  152m ISOVUE-300 IOPAMIDOL (ISOVUE-300) INJECTION 61% COMPARISON:  February 16, 2015 FINDINGS: Lower chest: Mild patchy consolidation of the right lower lobe is identified suspicious for developing pneumonia. The left lung is clear. The heart size is normal. Hepatobiliary: Calcified granulomas identified throughout liver. No other focal liver abnormality is seen. Status post cholecystectomy. No biliary dilatation. Pancreas: Unremarkable. No pancreatic ductal dilatation or surrounding inflammatory changes. Spleen: Normal in size without focal abnormality. Calcified granulomas are identified in the spleen. Adrenals/Urinary Tract: The bilateral adrenal glands are normal. There is a tiny cyst in the left kidney. The right kidney is normal. There is no hydronephrosis bilaterally. The bladder is  normal. Stomach/Bowel: There are thick walled dilated small bowel loops in the right lower quadrant with mild-to-moderate dilatation of the more proximal and mid small bowel loops. There is probable thick walled rectal sigmoid colon with mild surrounding stranding. The stomach is normal. Vascular/Lymphatic: Aortic atherosclerosis. No enlarged abdominal or pelvic lymph nodes. Reproductive: Status post hysterectomy. No adnexal masses. Other: None. Musculoskeletal: No acute abnormality noted. IMPRESSION: Findings consistent with Crohn's disease with areas of involvement including distal small bowel loops and possibly rectal sigmoid colon. There is a functional small-bowel obstruction of the mid and proximal small bowel loops due to inflammatory changes of distal small bowel loops. Electronically Signed   By: Abelardo Diesel M.D.   On: 07/31/2018 14:47      Impression / Plan:   CHERONDA ERCK is a 52 y.o. y/o female  with with a history of small bowel Crohn's disease with stenosis.  Failed Humira in the past lost to follow-up not been on any medications for more than 2 years.  Smoker.  Admitted to the hospital after a CT scan performed yesterday showed small bowel active disease as well as obstruction.  Presently her nausea vomiting and abdominal pain are better.  She gives some history of fever last night.  I was called from the emergency room this morning and asked for my advice regarding her treatment and I mentioned that she should be started on antibiotics because of the fever.  At this point of time I would suggest to also add IV hydrocortisone 100 mg 3 times daily for her active Crohn's.  Moving forward she would definitely need to go on a new biological agent such as Entyvio or Stelara.  She would strongly need to stop smoking as it worsens outcomes in Crohn's disease.  Once her active bowel obstruction has resolved she would act as an outpatient required an MR Enterogram to delineate her disease activity and stent.  I do not see any need to perform an upper endoscopy at this point of time.  In terms of a loose stool, I  suggest to check a stool for C. difficile and GI PCR and if positive treat.  Serial abdominal exam and if abdominal distention worsens suggest repeat imaging and NG tube for decompression.  At this point of time she has declined an NG tube  Thank you for involving me in the care of this patient.      LOS: 0 days   Jonathon Bellows, MD  08/01/2018, 4:42 PM

## 2018-08-01 NOTE — Consult Note (Signed)
Subjective:   CC: bowel obstruction  HPI:  [name] is a 52 y.o. female who was consulted by [facility] for issue above.  Symptoms were first noted 1 day ago. Pain is sharp, non-radiating, localized toward RLQ.   Associated with n/v. exacerbated by nothing specific.  Hx of crohn's in the past, not currently on any steroids or abx.  Never had surgery for Crohn's.  Last BM was yesterday.     Past Medical History:  has a past medical history of COPD (chronic obstructive pulmonary disease) ([place]) and Crohn's disease ([place]).  Past Surgical History:  Past Surgical History:  Procedure Laterality Date  . ABDOMINAL HYSTERECTOMY    . CHOLECYSTECTOMY    . EYE SURGERY      Family History: family history is not on file.  Social History:  reports that she has been smoking. She has never used smokeless tobacco. She reports that she does not drink alcohol or use drugs.  Current Medications:  (Not in a hospital admission)  Allergies:  Allergies as of 08/01/2018  . (No Known Allergies)    ROS:  A 15 point review of systems was performed and pertinent positives and negatives noted in HPI   Objective:     BP 109/75   Pulse 75   Temp 97.6 F (36.4 C) (Oral)   Resp 14   Ht 5' (1.524 m)   Wt 68.5 kg   SpO2 97%   BMI 29.49 kg/m   Constitutional :  alert, cooperative, appears stated age and no distress  Lymphatics/Throat:  no asymmetry, masses, or scars  Respiratory:  clear to auscultation bilaterally  Cardiovascular:  regular rate and rhythm  Gastrointestinal: soft, non-distended.  focal tenderness over right side of umbilicus, consistent with area of obstruction with no guarding.  no rebound or referred tenderness in any other quadrants..   Musculoskeletal: Steady gait and movement  Skin: Cool and moist,    Psychiatric: Normal affect, non-agitated, not confused       LABS:  CMP Latest Ref Rng & Units 02/18/2015 02/17/2015 02/16/2015  Glucose mg/dL 126(H) 122(H) 131(H)  BUN mg/dL 9  11 13   Creatinine mg/dL 0.51 0.54 0.75  Sodium mmol/L 141 140 139  Potassium mmol/L 3.7 4.0 4.1  Chloride mmol/L 111 109 104  CO2 mmol/L 27 26 26   Calcium mg/dL 8.8(L) 8.2(L) 10.3  Total Protein g/dL - - 8.2(H)  Total Bilirubin mg/dL - - 0.4  Alkaline Phos U/L - - 92  AST U/L - - 20  ALT U/L - - 16   CBC Latest Ref Rng & Units 08/01/2018 02/17/2015 02/16/2015  WBC 3.6 - 11.0 K/uL 14.4(H) 9.0 16.9(H)  Hemoglobin 12.0 - 16.0 g/dL 15.7 13.3 18.7(H)  Hematocrit 35.0 - 47.0 % 46.1 40.4 57.2(H)  Platelets 150 - 440 K/uL 388 246 367    RADS: CLINICAL DATA:  Abdominal bloating for 3 months. History of Crohn's disease  EXAM: CT ABDOMEN AND PELVIS WITH CONTRAST  TECHNIQUE: Multidetector CT imaging of the abdomen and pelvis was performed using the standard protocol following bolus administration of intravenous contrast.  CONTRAST:  136m ISOVUE-300 IOPAMIDOL (ISOVUE-300) INJECTION 61%  COMPARISON:  February 16, 2015  FINDINGS: Lower chest: Mild patchy consolidation of the right lower lobe is identified suspicious for developing pneumonia. The left lung is clear. The heart size is normal.  Hepatobiliary: Calcified granulomas identified throughout liver. No other focal liver abnormality is seen. Status post cholecystectomy. No biliary dilatation.  Pancreas: Unremarkable. No pancreatic ductal dilatation  or surrounding inflammatory changes.  Spleen: Normal in size without focal abnormality. Calcified granulomas are identified in the spleen.  Adrenals/Urinary Tract: The bilateral adrenal glands are normal. There is a tiny cyst in the left kidney. The right kidney is normal. There is no hydronephrosis bilaterally. The bladder is normal.  Stomach/Bowel: There are thick walled dilated small bowel loops in the right lower quadrant with mild-to-moderate dilatation of the more proximal and mid small bowel loops. There is probable thick walled rectal sigmoid colon with mild  surrounding stranding. The stomach is normal.  Vascular/Lymphatic: Aortic atherosclerosis. No enlarged abdominal or pelvic lymph nodes.  Reproductive: Status post hysterectomy. No adnexal masses.  Other: None.  Musculoskeletal: No acute abnormality noted.  IMPRESSION: Findings consistent with Crohn's disease with areas of involvement including distal small bowel loops and possibly rectal sigmoid colon. There is a functional small-bowel obstruction of the mid and proximal small bowel loops due to inflammatory changes of distal small bowel loops.   Electronically Signed   By: Abelardo Diesel M.D.   On: 07/31/2018 14:47  Assessment:   Crohn's disease flare, causing small bowel obstruction.    Plan:   Pt is a poor candidate for surgical intervention at this time due to her acute Crohn's flare.  No evidence of closed-loop obstruction or severe stricturing on review of CT and history/physical exam indicating any urgent surgical intervention.    GI recommendation of Cipro and Flagyl is not adequate choice at this time.  Recommend continuing nonsurgical management as long as possible.  Patient had a bad past experience with NG tube in the past and is currently declining placement.  We did explain to her the benefits risks alternatives to NG placement and she verbalized understanding and still wishes to hold off.  General surgery will follow peripherally for this patient. recommend continue medical management per GI and primary team.

## 2018-08-02 ENCOUNTER — Inpatient Hospital Stay: Payer: Medicaid Other

## 2018-08-02 LAB — GASTROINTESTINAL PANEL BY PCR, STOOL (REPLACES STOOL CULTURE)
ASTROVIRUS: NOT DETECTED
Adenovirus F40/41: NOT DETECTED
CAMPYLOBACTER SPECIES: NOT DETECTED
Cryptosporidium: NOT DETECTED
Cyclospora cayetanensis: NOT DETECTED
ENTAMOEBA HISTOLYTICA: NOT DETECTED
Enteroaggregative E coli (EAEC): NOT DETECTED
Enteropathogenic E coli (EPEC): NOT DETECTED
Enterotoxigenic E coli (ETEC): NOT DETECTED
Giardia lamblia: NOT DETECTED
NOROVIRUS GI/GII: DETECTED — AB
PLESIMONAS SHIGELLOIDES: NOT DETECTED
ROTAVIRUS A: NOT DETECTED
SAPOVIRUS (I, II, IV, AND V): NOT DETECTED
SHIGA LIKE TOXIN PRODUCING E COLI (STEC): NOT DETECTED
SHIGELLA/ENTEROINVASIVE E COLI (EIEC): NOT DETECTED
Salmonella species: NOT DETECTED
Vibrio cholerae: NOT DETECTED
Vibrio species: NOT DETECTED
Yersinia enterocolitica: NOT DETECTED

## 2018-08-02 LAB — BASIC METABOLIC PANEL
ANION GAP: 6 (ref 5–15)
BUN: 14 mg/dL (ref 6–20)
CALCIUM: 8.4 mg/dL — AB (ref 8.9–10.3)
CO2: 25 mmol/L (ref 22–32)
Chloride: 108 mmol/L (ref 98–111)
Creatinine, Ser: 0.57 mg/dL (ref 0.44–1.00)
GFR calc Af Amer: >60 mL/min (ref >60)
Glucose, Bld: 112 mg/dL — ABNORMAL HIGH (ref 70–99)
POTASSIUM: 3.9 mmol/L (ref 3.5–5.1)
SODIUM: 139 mmol/L (ref 135–145)

## 2018-08-02 LAB — C DIFFICILE QUICK SCREEN W PCR REFLEX
C DIFFICILE (CDIFF) INTERP: NOT DETECTED
C DIFFICILE (CDIFF) TOXIN: NEGATIVE
C DIFFICLE (CDIFF) ANTIGEN: NEGATIVE

## 2018-08-02 LAB — CBC
HEMATOCRIT: 36.5 % (ref 35.0–47.0)
Hemoglobin: 12.4 g/dL (ref 12.0–16.0)
MCH: 29.4 pg (ref 26.0–34.0)
MCHC: 34 g/dL (ref 32.0–36.0)
MCV: 86.4 fL (ref 80.0–100.0)
PLATELETS: 282 10*3/uL (ref 150–440)
RBC: 4.22 MIL/uL (ref 3.80–5.20)
RDW: 14.2 % (ref 11.5–14.5)
WBC: 8.5 10*3/uL (ref 3.6–11.0)

## 2018-08-02 LAB — GLUCOSE, CAPILLARY: GLUCOSE-CAPILLARY: 91 mg/dL (ref 70–99)

## 2018-08-02 LAB — HIV ANTIBODY (ROUTINE TESTING W REFLEX): HIV SCREEN 4TH GENERATION: NONREACTIVE

## 2018-08-02 MED ORDER — NICOTINE 14 MG/24HR TD PT24
14.0000 mg | MEDICATED_PATCH | Freq: Every day | TRANSDERMAL | Status: DC
  Administered 2018-08-02: 14 mg via TRANSDERMAL
  Filled 2018-08-02: qty 1

## 2018-08-02 NOTE — Progress Notes (Signed)
[facility] Inpatient Progress Note  Subjective: Patient seen for partial small bowel obstruction/ileus presumably related to  norovirus infection which has been confirmed this morning.  Patient has had a bowel movement early this morning along with a large amount of flatus last evening.  She has had no nausea or vomiting.  She would like to eat.  A full liquid diet has been started by Dr. Earleen Newport.   Objective: Vital signs in last 24 hours: Temp:  [98.1 F (36.7 C)-98.7 F (37.1 C)] 98.1 F (36.7 C) (09/28 0534) Pulse Rate:  [59-84] 59 (09/28 0534) Resp:  [13-19] 18 (09/28 0534) BP: (95-129)/(62-86) 95/62 (09/28 0534) SpO2:  [96 %-100 %] 99 % (09/28 0534) Blood pressure 95/62, pulse (!) 59, temperature 98.1 F (36.7 C), temperature source Oral, resp. rate 18, height 5' (1.524 m), weight 68.5 kg, SpO2 99 %.    Intake/Output from previous day: 09/27 0701 - 09/28 0700 In: 1080.5 [I.V.:56.9; IV Piggyback:1023.6] Out: -   Intake/Output this shift: No intake/output data recorded.   General appearance: Alert, no acute distress Resp: Clear to auscultation Cardio: Regular rate, no gallop GI: Mildly diffusely tender, soft and nondistended without rebound guarding or rigidity.  No masses.  Bowel sounds positive. Extremities: No edema.   Lab Results: Results for orders placed or performed during the hospital encounter of 08/01/18 (from the past 24 hour(s))  Lactic acid, plasma     Status: None   Collection Time: 08/01/18  1:04 PM  Result Value Ref Range   Lactic Acid, Venous 0.8 0.5 - 1.9 mmol/L  Blood Culture (routine x 2)     Status: None (Preliminary result)   Collection Time: 08/01/18  1:05 PM  Result Value Ref Range   Specimen Description BLOOD RIGHT HAND    Special Requests      BOTTLES DRAWN AEROBIC AND ANAEROBIC Blood Culture adequate volume   Culture      NO GROWTH < 24 HOURS Performed at [facility], [address] [place], [place]  [address]    Report Status PENDING   Blood Culture (routine x 2)     Status: None (Preliminary result)   Collection Time: 08/01/18  1:06 PM  Result Value Ref Range   Specimen Description BLOOD LEFT ARM    Special Requests      BOTTLES DRAWN AEROBIC AND ANAEROBIC Blood Culture results may not be optimal due to an excessive volume of blood received in culture bottles   Culture      NO GROWTH < 24 HOURS Performed at [facility], [address] [place], [place] [address]    Report Status PENDING   Comprehensive metabolic panel     Status: Abnormal   Collection Time: 08/01/18  5:10 PM  Result Value Ref Range   Sodium 141 135 - 145 mmol/L   Potassium 4.0 3.5 - 5.1 mmol/L   Chloride 107 98 - 111 mmol/L   CO2 27 22 - 32 mmol/L   Glucose, Bld 97 70 - 99 mg/dL   BUN 17 6 - 20 mg/dL   Creatinine, Ser 0.60 0.44 - 1.00 mg/dL   Calcium 8.4 (L) 8.9 - 10.3 mg/dL   Total Protein 6.1 (L) 6.5 - 8.1 g/dL   Albumin 3.2 (L) 3.5 - 5.0 g/dL   AST 15 15 - 41 U/L   ALT 11 0 - 44 U/L   Alkaline Phosphatase 87 38 - 126 U/L   Total Bilirubin 0.7 0.3 - 1.2 mg/dL  GFR calc non Af Amer >60 >60 mL/min   GFR calc Af Amer >60 >60 mL/min   Anion gap 7 5 - 15  Lipase, blood     Status: None   Collection Time: 08/01/18  5:10 PM  Result Value Ref Range   Lipase 30 11 - 51 U/L  HIV antibody (Routine Testing)     Status: None   Collection Time: 08/01/18  5:10 PM  Result Value Ref Range   HIV Screen 4th Generation wRfx Non Reactive Non Reactive  Gastrointestinal Panel by PCR , Stool     Status: Abnormal   Collection Time: 08/02/18  5:33 AM  Result Value Ref Range   Campylobacter species NOT DETECTED NOT DETECTED   Plesimonas shigelloides NOT DETECTED NOT DETECTED   Salmonella species NOT DETECTED NOT DETECTED   Yersinia enterocolitica NOT DETECTED NOT DETECTED   Vibrio species NOT DETECTED NOT DETECTED   Vibrio cholerae NOT DETECTED NOT DETECTED   Enteroaggregative E coli (EAEC) NOT DETECTED NOT  DETECTED   Enteropathogenic E coli (EPEC) NOT DETECTED NOT DETECTED   Enterotoxigenic E coli (ETEC) NOT DETECTED NOT DETECTED   Shiga like toxin producing E coli (STEC) NOT DETECTED NOT DETECTED   Shigella/Enteroinvasive E coli (EIEC) NOT DETECTED NOT DETECTED   Cryptosporidium NOT DETECTED NOT DETECTED   Cyclospora cayetanensis NOT DETECTED NOT DETECTED   Entamoeba histolytica NOT DETECTED NOT DETECTED   Giardia lamblia NOT DETECTED NOT DETECTED   Adenovirus F40/41 NOT DETECTED NOT DETECTED   Astrovirus NOT DETECTED NOT DETECTED   Norovirus GI/GII DETECTED (A) NOT DETECTED   Rotavirus A NOT DETECTED NOT DETECTED   Sapovirus (I, II, IV, and V) NOT DETECTED NOT DETECTED  C difficile quick scan w PCR reflex     Status: None   Collection Time: 08/02/18  5:33 AM  Result Value Ref Range   C Diff antigen NEGATIVE NEGATIVE   C Diff toxin NEGATIVE NEGATIVE   C Diff interpretation No C. difficile detected.   Basic metabolic panel     Status: Abnormal   Collection Time: 08/02/18  5:35 AM  Result Value Ref Range   Sodium 139 135 - 145 mmol/L   Potassium 3.9 3.5 - 5.1 mmol/L   Chloride 108 98 - 111 mmol/L   CO2 25 22 - 32 mmol/L   Glucose, Bld 112 (H) 70 - 99 mg/dL   BUN 14 6 - 20 mg/dL   Creatinine, Ser 0.57 0.44 - 1.00 mg/dL   Calcium 8.4 (L) 8.9 - 10.3 mg/dL   GFR calc non Af Amer >60 >60 mL/min   GFR calc Af Amer >60 >60 mL/min   Anion gap 6 5 - 15  CBC     Status: None   Collection Time: 08/02/18  5:35 AM  Result Value Ref Range   WBC 8.5 3.6 - 11.0 K/uL   RBC 4.22 3.80 - 5.20 MIL/uL   Hemoglobin 12.4 12.0 - 16.0 g/dL   HCT 36.5 35.0 - 47.0 %   MCV 86.4 80.0 - 100.0 fL   MCH 29.4 26.0 - 34.0 pg   MCHC 34.0 32.0 - 36.0 g/dL   RDW 14.2 11.5 - 14.5 %   Platelets 282 150 - 440 K/uL  Glucose, capillary     Status: None   Collection Time: 08/02/18  8:07 AM  Result Value Ref Range   Glucose-Capillary 91 70 - 99 mg/dL   Comment 1 Notify RN      Recent Labs  08/01/18 0938  08/02/18 0535  WBC 14.4* 8.5  HGB 15.7 12.4  HCT 46.1 36.5  PLT 388 282   BMET Recent Labs    08/01/18 1710 08/02/18 0535  NA 141 139  K 4.0 3.9  CL 107 108  CO2 27 25  GLUCOSE 97 112*  BUN 17 14  CREATININE 0.60 0.57  CALCIUM 8.4* 8.4*   LFT Recent Labs    08/01/18 1710  PROT 6.1*  ALBUMIN 3.2*  AST 15  ALT 11  ALKPHOS 87  BILITOT 0.7   PT/INR No results for input(s): LABPROT, INR in the last 72 hours. Hepatitis Panel No results for input(s): HEPBSAG, HCVAB, HEPAIGM, HEPBIGM in the last 72 hours. C-Diff Recent Labs    08/02/18 0533  CDIFFTOX NEGATIVE   No results for input(s): CDIFFPCR in the last 72 hours.   Studies/Results: No results found.  Scheduled Inpatient Medications:   . heparin  5,000 Units Subcutaneous Q8H  . hydrocortisone sod succinate (SOLU-CORTEF) inj  100 mg Intravenous Q8H    Continuous Inpatient Infusions:   . sodium chloride 75 mL/hr at 08/02/18 1013  . ciprofloxacin 400 mg (08/02/18 0038)  . famotidine (PEPCID) IV 20 mg (08/02/18 1013)  . metronidazole 500 mg (08/02/18 0501)    PRN Inpatient Medications:  acetaminophen **OR** acetaminophen, albuterol, bisacodyl, HYDROcodone-acetaminophen, morphine injection, ondansetron **OR** ondansetron (ZOFRAN) IV, traZODone  Miscellaneous:   Assessment:  1.  History of presumed small bowel Crohn's disease- small bowel ileus noted on CT scan.  Follow-up KUB pending.   2. Ileus - Resolving.  3. Norovirus Positive G I panel.  Plan:  1.  Agree with full liquid diet. Continue supportive care. 2.  Would probably discharge home from a GI standpoint if tolerates diet without nausea vomiting or abdominal pain. 3.  We will discontinue steroids for now. 4.  Advised discontinuance of antibiotics if deemed medically feasible by attending physician. 5.  Follow-up with me in the office in 2 to 3 weeks in preparation for colonoscopy and upper endoscopy which has been scheduled with me on September 03, 2018.  Teodoro K. Alice Reichert, M.D. 08/02/2018, 10:32 AM

## 2018-08-02 NOTE — Progress Notes (Signed)
Patient ID: [name], female   DOB: 10-Jan-1966, 52 y.o.   MRN: [id number]  [facility] PROGRESS NOTE  ELNITA SURPRENANT YKZ:[id number] DOB: 10-21-1966 DOA: 08/01/2018 PCP: Lorelee Market, MD  HPI/Subjective: Patient seen this morning and still having some generalized abdominal pain.  Had 2 bowel movements this morning and passing gas.  No nausea or vomiting.  Patient states that she was thirsty.  Objective: Vitals:   08/02/18 0534 08/02/18 1246  BP: 95/62 107/65  Pulse: (!) 59 67  Resp: 18 16  Temp: 98.1 F (36.7 C) 97.9 F (36.6 C)  SpO2: 99% 98%    Filed Weights   08/01/18 0916  Weight: 68.5 kg    ROS: Review of Systems  Constitutional: Negative for chills and fever.  Eyes: Negative for blurred vision.  Respiratory: Negative for cough and shortness of breath.   Cardiovascular: Negative for chest pain.  Gastrointestinal: Positive for abdominal pain and diarrhea. Negative for constipation, nausea and vomiting.  Genitourinary: Negative for dysuria.  Musculoskeletal: Negative for joint pain.  Neurological: Negative for dizziness and headaches.   Exam: Physical Exam  Constitutional: She is oriented to person, place, and time.  HENT:  Nose: No mucosal edema.  Mouth/Throat: No oropharyngeal exudate or posterior oropharyngeal edema.  Eyes: Pupils are equal, round, and reactive to light. Conjunctivae, EOM and lids are normal.  Neck: No JVD present. Carotid bruit is not present. No edema present. No thyroid mass and no thyromegaly present.  Cardiovascular: S1 normal and S2 normal. Exam reveals no gallop.  No murmur heard. Pulses:      Dorsalis pedis pulses are 2+ on the right side, and 2+ on the left side.  Respiratory: No respiratory distress. She has no wheezes. She has no rhonchi. She has no rales.  GI: Soft. Bowel sounds are normal. There is generalized tenderness.  Musculoskeletal:       Right ankle: She exhibits no swelling.       Left ankle: She exhibits no  swelling.  Lymphadenopathy:    She has no cervical adenopathy.  Neurological: She is alert and oriented to person, place, and time. No cranial nerve deficit.  Skin: Skin is warm. No rash noted. Nails show no clubbing.  Psychiatric: She has a normal mood and affect.      Data Reviewed: Basic Metabolic Panel: Recent Labs  Lab 08/01/18 1710 08/02/18 0535  NA 141 139  K 4.0 3.9  CL 107 108  CO2 27 25  GLUCOSE 97 112*  BUN 17 14  CREATININE 0.60 0.57  CALCIUM 8.4* 8.4*   Liver Function Tests: Recent Labs  Lab 08/01/18 1710  AST 15  ALT 11  ALKPHOS 87  BILITOT 0.7  PROT 6.1*  ALBUMIN 3.2*   Recent Labs  Lab 08/01/18 1710  LIPASE 30   CBC: Recent Labs  Lab 08/01/18 0938 08/02/18 0535  WBC 14.4* 8.5  HGB 15.7 12.4  HCT 46.1 36.5  MCV 86.2 86.4  PLT 388 282    CBG: Recent Labs  Lab 08/02/18 0807  GLUCAP 91    Recent Results (from the past 240 hour(s))  Blood Culture (routine x 2)     Status: None (Preliminary result)   Collection Time: 08/01/18  1:05 PM  Result Value Ref Range Status   Specimen Description BLOOD RIGHT HAND  Final   Special Requests   Final    BOTTLES DRAWN AEROBIC AND ANAEROBIC Blood Culture adequate volume   Culture   Final  NO GROWTH < 24 HOURS Performed at [facility], [address] [place], [place] [address]    Report Status PENDING  Incomplete  Blood Culture (routine x 2)     Status: None (Preliminary result)   Collection Time: 08/01/18  1:06 PM  Result Value Ref Range Status   Specimen Description BLOOD LEFT ARM  Final   Special Requests   Final    BOTTLES DRAWN AEROBIC AND ANAEROBIC Blood Culture results may not be optimal due to an excessive volume of blood received in culture bottles   Culture   Final    NO GROWTH < 24 HOURS Performed at [facility], [address] [place], [place] [address]    Report Status PENDING  Incomplete  Gastrointestinal Panel by PCR , Stool     Status: Abnormal    Collection Time: 08/02/18  5:33 AM  Result Value Ref Range Status   Campylobacter species NOT DETECTED NOT DETECTED Final   Plesimonas shigelloides NOT DETECTED NOT DETECTED Final   Salmonella species NOT DETECTED NOT DETECTED Final   Yersinia enterocolitica NOT DETECTED NOT DETECTED Final   Vibrio species NOT DETECTED NOT DETECTED Final   Vibrio cholerae NOT DETECTED NOT DETECTED Final   Enteroaggregative E coli (EAEC) NOT DETECTED NOT DETECTED Final   Enteropathogenic E coli (EPEC) NOT DETECTED NOT DETECTED Final   Enterotoxigenic E coli (ETEC) NOT DETECTED NOT DETECTED Final   Shiga like toxin producing E coli (STEC) NOT DETECTED NOT DETECTED Final   Shigella/Enteroinvasive E coli (EIEC) NOT DETECTED NOT DETECTED Final   Cryptosporidium NOT DETECTED NOT DETECTED Final   Cyclospora cayetanensis NOT DETECTED NOT DETECTED Final   Entamoeba histolytica NOT DETECTED NOT DETECTED Final   Giardia lamblia NOT DETECTED NOT DETECTED Final   Adenovirus F40/41 NOT DETECTED NOT DETECTED Final   Astrovirus NOT DETECTED NOT DETECTED Final   Norovirus GI/GII DETECTED (A) NOT DETECTED Final    Comment: RESULT CALLED TO, READ BACK BY AND VERIFIED WITH: TERRY TURNER AT 3785 ON 08/02/18 BY SNJ    Rotavirus A NOT DETECTED NOT DETECTED Final   Sapovirus (I, II, IV, and V) NOT DETECTED NOT DETECTED Final    Comment: Performed at [facility], [address] [place], [place] [address]  C difficile quick scan w PCR reflex     Status: None   Collection Time: 08/02/18  5:33 AM  Result Value Ref Range Status   C Diff antigen NEGATIVE NEGATIVE Final   C Diff toxin NEGATIVE NEGATIVE Final   C Diff interpretation No C. difficile detected.  Final    Comment: Performed at [facility], [address] [place],  [address]     Studies: Dg Abd 2 Views  Result Date: 08/02/2018 CLINICAL DATA:  Acute generalized abdominal pain. EXAM: ABDOMEN - 2 VIEW COMPARISON:  Radiographs of February 18, 2015. FINDINGS: Mildly dilated small bowel loops are noted in epigastric region concerning for distal small bowel obstruction. Residual contrast is noted in nondilated colon. There is no evidence of free air. No radio-opaque calculi or other significant radiographic abnormality is seen. IMPRESSION: Mildly dilated small bowel loops are noted concerning for distal small bowel obstruction. Electronically Signed   By: Marijo Conception, M.D.   On: 08/02/2018 14:04    Scheduled Meds: . heparin  5,000 Units Subcutaneous Q8H   Continuous Infusions: . famotidine (PEPCID) IV 20 mg (08/02/18 1013)    Assessment/Plan:   1. Norovirus, abdominal pain and diarrhea.  Supportive care.  Discontinue oral antibiotics. 2. History of Crohn's disease.  Gastroenterology stop steroids. 3. Functional obstruction seen on CT scan.  Patient having bowel movements and passing gas.  Start on liquid diet and advance as tolerated. 4. History of COPD.  Respiratory status stable  Code Status:     Code Status Orders  (From admission, onward)         Start     Ordered   08/01/18 1604  Full code  Continuous     08/01/18 1607        Code Status History    This patient has a current code status but no historical code status.      Disposition Plan: To be determined based on clinical course  Consultants:  Gastroenterology  Surgery  Time spent: 27 minutes  [place]

## 2018-08-02 NOTE — Progress Notes (Signed)
Notified by lab, Patient positive for Norovirus. Enteric Precautions will remain in place.

## 2018-08-03 LAB — GLUCOSE, CAPILLARY: GLUCOSE-CAPILLARY: 73 mg/dL (ref 70–99)

## 2018-08-03 MED ORDER — NICOTINE 14 MG/24HR TD PT24: MEDICATED_PATCH | TRANSDERMAL | 0 refills | Status: DC

## 2018-08-03 NOTE — Discharge Summary (Signed)
[facility] [place] NAME: [name]    MR#:  [id number]  DATE OF BIRTH:  30-Sep-1966  DATE OF ADMISSION:  08/01/2018 ADMITTING PHYSICIAN: Epifanio Lesches, MD  DATE OF DISCHARGE: 08/03/2018  PRIMARY CARE PHYSICIAN: Lorelee Market, MD    ADMISSION DIAGNOSIS:  Crohn's disease of colon with intestinal obstruction ([place]) [T01.779]  DISCHARGE DIAGNOSIS:  Norovirus  SECONDARY DIAGNOSIS:   Past Medical History:  Diagnosis Date  . COPD (chronic obstructive pulmonary disease) ([place])   . Crohn's disease ([place])     HOSPITAL COURSE:   1.  Norovirus gastroenteritis.  Patient with nausea vomiting diarrhea and abdominal pain.  Patient feeling better upon discharge home.  Was able to tolerate food and liquid.  Still having some diarrhea but patient states that she always has diarrhea ever since her gallbladder was taken out. 2.  Crohn's disease, functional bowel obstruction seen on CT scan and x-ray.  Patient was passing gas and had bowel movement so this is not a bowel obstruction.  The gastroenterologist stopped the steroids and did not believe this was a Crohn's exacerbation. 3.  COPD.  Respiratory status stable 4.  Tobacco abuse.  Nicotine patch prescribed  DISCHARGE CONDITIONS:   Satisfactory  CONSULTS OBTAINED:  Treatment Team:  Benjamine Sprague, DO Jonathon Bellows, MD  DRUG ALLERGIES:  No Known Allergies  DISCHARGE MEDICATIONS:   Allergies as of 08/03/2018   No Known Allergies     Medication List    STOP taking these medications   amoxicillin 500 MG tablet Commonly known as:  AMOXIL   HYDROcodone-acetaminophen 5-325 MG tablet Commonly known as:  NORCO/VICODIN   ibuprofen 800 MG tablet Commonly known as:  ADVIL,MOTRIN   lidocaine 2 % solution Commonly known as:  XYLOCAINE     TAKE these medications   albuterol 108 (90 Base) MCG/ACT inhaler Commonly known as:  PROVENTIL HFA;VENTOLIN HFA Inhale 2 puffs into the lungs  every 4 (four) hours as needed for wheezing or shortness of breath.   dicyclomine 10 MG capsule Commonly known as:  BENTYL Take 10 mg by mouth 3 (three) times daily as needed for cramping.   FLOVENT IN Inhale 2 puffs into the lungs daily.   FLUoxetine 40 MG capsule Commonly known as:  PROZAC Take 40 mg by mouth daily.   hydrOXYzine 25 MG tablet Commonly known as:  ATARAX/VISTARIL Take 25 mg by mouth at bedtime.   nicotine 14 mg/24hr patch Commonly known as:  NICODERM CQ - dosed in mg/24 hours One patch daily (can substitute generic patch)   pantoprazole 40 MG tablet Commonly known as:  PROTONIX Take 40 mg by mouth daily.        DISCHARGE INSTRUCTIONS:   Follow-up PMD 5 days Follow-up gastroenterology as scheduled  If you experience worsening of your admission symptoms, develop shortness of breath, life threatening emergency, suicidal or homicidal thoughts you must seek medical attention immediately by calling 911 or calling your MD immediately  if symptoms less severe.  You Must read complete instructions/literature along with all the possible adverse reactions/side effects for all the Medicines you take and that have been prescribed to you. Take any new Medicines after you have completely understood and accept all the possible adverse reactions/side effects.   Please note  You were cared for by a hospitalist during your hospital stay. If you have any questions about your discharge medications or the care you received while you were in the hospital after you are discharged,  you can call the unit and asked to speak with the hospitalist on call if the hospitalist that took care of you is not available. Once you are discharged, your primary care physician will handle any further medical issues. Please note that NO REFILLS for any discharge medications will be authorized once you are discharged, as it is imperative that you return to your primary care physician (or establish a  relationship with a primary care physician if you do not have one) for your aftercare needs so that they can reassess your need for medications and monitor your lab values.    Today   CHIEF COMPLAINT:   Chief Complaint  Patient presents with  . Nausea  . Emesis    HISTORY OF PRESENT ILLNESS:  [name]  is a 52 y.o. female presented with nausea vomiting and diarrhea and abdominal pain   VITAL SIGNS:  Blood pressure 109/72, pulse 64, temperature 97.9 F (36.6 C), temperature source Oral, resp. rate 16, height 5' (1.524 m), weight 70.9 kg, SpO2 98 %.    PHYSICAL EXAMINATION:  GENERAL:  52 y.o.-year-old patient lying in the bed with no acute distress.  EYES: Pupils equal, round, reactive to light and accommodation. No scleral icterus. Extraocular muscles intact.  HEENT: Head atraumatic, normocephalic. Oropharynx and nasopharynx clear.  NECK:  Supple, no jugular venous distention. No thyroid enlargement, no tenderness.  LUNGS: Normal breath sounds bilaterally, no wheezing, rales,rhonchi or crepitation. No use of accessory muscles of respiration.  CARDIOVASCULAR: S1, S2 normal. No murmurs, rubs, or gallops.  ABDOMEN: Soft, non-tender, non-distended. Bowel sounds present. No organomegaly or mass.  EXTREMITIES: No pedal edema, cyanosis, or clubbing.  NEUROLOGIC: Cranial nerves II through XII are intact. Muscle strength 5/5 in all extremities. Sensation intact. Gait not checked.  PSYCHIATRIC: The patient is alert and oriented x 3.  SKIN: No obvious rash, lesion, or ulcer.   DATA REVIEW:   CBC Recent Labs  Lab 08/02/18 0535  WBC 8.5  HGB 12.4  HCT 36.5  PLT 282    Chemistries  Recent Labs  Lab 08/01/18 1710 08/02/18 0535  NA 141 139  K 4.0 3.9  CL 107 108  CO2 27 25  GLUCOSE 97 112*  BUN 17 14  CREATININE 0.60 0.57  CALCIUM 8.4* 8.4*  AST 15  --   ALT 11  --   ALKPHOS 87  --   BILITOT 0.7  --      Microbiology Results  Results for orders placed or  performed during the hospital encounter of 08/01/18  Blood Culture (routine x 2)     Status: None (Preliminary result)   Collection Time: 08/01/18  1:05 PM  Result Value Ref Range Status   Specimen Description BLOOD RIGHT HAND  Final   Special Requests   Final    BOTTLES DRAWN AEROBIC AND ANAEROBIC Blood Culture adequate volume   Culture   Final    NO GROWTH 2 DAYS Performed at [facility], [address] [place], [place] [address]    Report Status PENDING  Incomplete  Blood Culture (routine x 2)     Status: None (Preliminary result)   Collection Time: 08/01/18  1:06 PM  Result Value Ref Range Status   Specimen Description BLOOD LEFT ARM  Final   Special Requests   Final    BOTTLES DRAWN AEROBIC AND ANAEROBIC Blood Culture results may not be optimal due to an excessive volume of blood received in culture bottles   Culture  Final    NO GROWTH 2 DAYS Performed at [facility], [address] [place], [place] [address]    Report Status PENDING  Incomplete  Gastrointestinal Panel by PCR , Stool     Status: Abnormal   Collection Time: 08/02/18  5:33 AM  Result Value Ref Range Status   Campylobacter species NOT DETECTED NOT DETECTED Final   Plesimonas shigelloides NOT DETECTED NOT DETECTED Final   Salmonella species NOT DETECTED NOT DETECTED Final   Yersinia enterocolitica NOT DETECTED NOT DETECTED Final   Vibrio species NOT DETECTED NOT DETECTED Final   Vibrio cholerae NOT DETECTED NOT DETECTED Final   Enteroaggregative E coli (EAEC) NOT DETECTED NOT DETECTED Final   Enteropathogenic E coli (EPEC) NOT DETECTED NOT DETECTED Final   Enterotoxigenic E coli (ETEC) NOT DETECTED NOT DETECTED Final   Shiga like toxin producing E coli (STEC) NOT DETECTED NOT DETECTED Final   Shigella/Enteroinvasive E coli (EIEC) NOT DETECTED NOT DETECTED Final   Cryptosporidium NOT DETECTED NOT DETECTED Final   Cyclospora cayetanensis NOT DETECTED NOT DETECTED Final   Entamoeba  histolytica NOT DETECTED NOT DETECTED Final   Giardia lamblia NOT DETECTED NOT DETECTED Final   Adenovirus F40/41 NOT DETECTED NOT DETECTED Final   Astrovirus NOT DETECTED NOT DETECTED Final   Norovirus GI/GII DETECTED (A) NOT DETECTED Final    Comment: RESULT CALLED TO, READ BACK BY AND VERIFIED WITH: TERRY TURNER AT 3354 ON 08/02/18 BY SNJ    Rotavirus A NOT DETECTED NOT DETECTED Final   Sapovirus (I, II, IV, and V) NOT DETECTED NOT DETECTED Final    Comment: Performed at [facility], [address] [place], [place] [address]  C difficile quick scan w PCR reflex     Status: None   Collection Time: 08/02/18  5:33 AM  Result Value Ref Range Status   C Diff antigen NEGATIVE NEGATIVE Final   C Diff toxin NEGATIVE NEGATIVE Final   C Diff interpretation No C. difficile detected.  Final    Comment: Performed at [facility], [address] [place], [place] [address]    RADIOLOGY:  Bea Graff 2 Views  Result Date: 08/02/2018 CLINICAL DATA:  Acute generalized abdominal pain. EXAM: ABDOMEN - 2 VIEW COMPARISON:  Radiographs of February 18, 2015. FINDINGS: Mildly dilated small bowel loops are noted in epigastric region concerning for distal small bowel obstruction. Residual contrast is noted in nondilated colon. There is no evidence of free air. No radio-opaque calculi or other significant radiographic abnormality is seen. IMPRESSION: Mildly dilated small bowel loops are noted concerning for distal small bowel obstruction. Electronically Signed   By: Marijo Conception, M.D.   On: 08/02/2018 14:04       Management plans discussed with the patient, and she is in agreement.  CODE STATUS:     Code Status Orders  (From admission, onward)         Start     Ordered   08/01/18 1604  Full code  Continuous     08/01/18 1607        Code Status History    This patient has a current code status but no historical code status.      TOTAL TIME TAKING CARE OF THIS PATIENT: 35  minutes.    Loletha Grayer M.D on 08/03/2018 at 2:11 PM  Between 7am to 6pm - Pager - [phone]  After 6pm go to [email or web address] - Proofreader  [facility] Office  [phone]  CC:  Primary care physician; Lorelee Market, MD

## 2018-08-03 NOTE — Progress Notes (Signed)
Patient cleared for discharge.        Education complete. AVS printed. Discharge instructions given. All questions answered for patient clarification.  Prescriptions given, pharmacy verified.  IV removed.  Discharged to home Via POV

## 2018-08-06 LAB — CULTURE, BLOOD (ROUTINE X 2)
CULTURE: NO GROWTH
Culture: NO GROWTH
Special Requests: ADEQUATE

## 2018-09-03 ENCOUNTER — Other Ambulatory Visit: Payer: Self-pay | Admitting: Internal Medicine

## 2018-09-03 ENCOUNTER — Ambulatory Visit: Admission: RE | Admit: 2018-09-03 | Payer: Medicaid Other | Source: Ambulatory Visit | Admitting: Internal Medicine

## 2018-09-03 ENCOUNTER — Encounter: Admission: RE | Payer: Self-pay | Source: Ambulatory Visit

## 2018-09-03 DIAGNOSIS — Z8719 Personal history of other diseases of the digestive system: Secondary | ICD-10-CM

## 2018-09-03 SURGERY — ESOPHAGOGASTRODUODENOSCOPY (EGD) WITH PROPOFOL
Anesthesia: General

## 2018-09-08 ENCOUNTER — Ambulatory Visit
Admission: RE | Admit: 2018-09-08 | Discharge: 2018-09-08 | Disposition: A | Discharge status: Home / Self Care | Payer: Medicaid Other | Source: Ambulatory Visit | Attending: Internal Medicine | Admitting: Internal Medicine

## 2018-09-08 DIAGNOSIS — Z8719 Personal history of other diseases of the digestive system: Secondary | ICD-10-CM | POA: Diagnosis present

## 2018-09-09 DIAGNOSIS — K5080 Crohn's disease of both small and large intestine without complications: Secondary | ICD-10-CM | POA: Insufficient documentation

## 2018-11-17 ENCOUNTER — Encounter: Payer: Self-pay | Admitting: *Deleted

## 2018-11-18 ENCOUNTER — Ambulatory Visit
Admission: RE | Admit: 2018-11-18 | Discharge: 2018-11-18 | Disposition: A | Discharge status: Home / Self Care | Payer: Medicaid Other | Attending: Internal Medicine | Admitting: Internal Medicine

## 2018-11-18 ENCOUNTER — Ambulatory Visit: Payer: Medicaid Other | Admitting: Anesthesiology

## 2018-11-18 ENCOUNTER — Encounter
Admission: RE | Disposition: A | Discharge status: Home / Self Care | Payer: Self-pay | Source: Home / Self Care | Attending: Internal Medicine

## 2018-11-18 DIAGNOSIS — K2950 Unspecified chronic gastritis without bleeding: Secondary | ICD-10-CM | POA: Diagnosis not present

## 2018-11-18 DIAGNOSIS — J449 Chronic obstructive pulmonary disease, unspecified: Secondary | ICD-10-CM | POA: Insufficient documentation

## 2018-11-18 DIAGNOSIS — Z86010 Personal history of colonic polyps: Secondary | ICD-10-CM | POA: Insufficient documentation

## 2018-11-18 DIAGNOSIS — Z1211 Encounter for screening for malignant neoplasm of colon: Secondary | ICD-10-CM | POA: Insufficient documentation

## 2018-11-18 DIAGNOSIS — K641 Second degree hemorrhoids: Secondary | ICD-10-CM | POA: Diagnosis not present

## 2018-11-18 DIAGNOSIS — K50912 Crohn's disease, unspecified, with intestinal obstruction: Secondary | ICD-10-CM | POA: Insufficient documentation

## 2018-11-18 DIAGNOSIS — Z79899 Other long term (current) drug therapy: Secondary | ICD-10-CM | POA: Insufficient documentation

## 2018-11-18 DIAGNOSIS — F329 Major depressive disorder, single episode, unspecified: Secondary | ICD-10-CM | POA: Diagnosis not present

## 2018-11-18 DIAGNOSIS — F17200 Nicotine dependence, unspecified, uncomplicated: Secondary | ICD-10-CM | POA: Diagnosis not present

## 2018-11-18 HISTORY — DX: Personal history of colon polyps, unspecified: Z86.0100

## 2018-11-18 HISTORY — PX: ESOPHAGOGASTRODUODENOSCOPY (EGD) WITH PROPOFOL: SHX5813

## 2018-11-18 HISTORY — DX: Major depressive disorder, single episode, unspecified: F32.9

## 2018-11-18 HISTORY — PX: COLONOSCOPY WITH PROPOFOL: SHX5780

## 2018-11-18 HISTORY — DX: Depression, unspecified: F32.A

## 2018-11-18 HISTORY — DX: Personal history of colonic polyps: Z86.010

## 2018-11-18 SURGERY — COLONOSCOPY WITH PROPOFOL
Anesthesia: General

## 2018-11-18 MED ORDER — LIDOCAINE HCL (CARDIAC) PF 100 MG/5ML IV SOSY
PREFILLED_SYRINGE | INTRAVENOUS | Status: DC | PRN
  Administered 2018-11-18: 50 mg via INTRAVENOUS

## 2018-11-18 MED ORDER — PROPOFOL 500 MG/50ML IV EMUL
INTRAVENOUS | Status: DC | PRN
  Administered 2018-11-18: 175 ug/kg/min via INTRAVENOUS

## 2018-11-18 MED ORDER — PROPOFOL 500 MG/50ML IV EMUL
INTRAVENOUS | Status: AC
  Filled 2018-11-18: qty 50

## 2018-11-18 MED ORDER — LIDOCAINE HCL (PF) 1 % IJ SOLN
2.0000 mL | Freq: Once | INTRAMUSCULAR | Status: AC
  Administered 2018-11-18: 0.3 mL via INTRADERMAL

## 2018-11-18 MED ORDER — LIDOCAINE HCL (PF) 2 % IJ SOLN
INTRAMUSCULAR | Status: AC
  Filled 2018-11-18: qty 10

## 2018-11-18 MED ORDER — SODIUM CHLORIDE 0.9 % IV SOLN
INTRAVENOUS | Status: DC
  Administered 2018-11-18: 1000 mL via INTRAVENOUS

## 2018-11-18 MED ORDER — PROPOFOL 10 MG/ML IV BOLUS
INTRAVENOUS | Status: DC | PRN
  Administered 2018-11-18: 60 mg via INTRAVENOUS
  Administered 2018-11-18 (×2): 40 mg via INTRAVENOUS

## 2018-11-18 MED ORDER — PROPOFOL 10 MG/ML IV BOLUS
INTRAVENOUS | Status: AC
  Filled 2018-11-18: qty 20

## 2018-11-18 NOTE — Op Note (Signed)
[facility] Gastroenterology Patient Name: [name] Procedure Date: 11/18/2018 9:43 AM MRN: [id number] Account #: [id number] Date of Birth: 1966-05-23 Admit Type: Outpatient Age: 53 Room: [facility] ENDO ROOM 2 Gender: Female Note Status: Finalized Procedure:            Upper GI endoscopy Indications:          Generalized abdominal pain, Crohn's disease Providers:            Benay Pike. Alice Reichert MD, MD Referring MD:         Lorelee Market (Referring MD) Medicines:            Propofol per Anesthesia Complications:        No immediate complications. Procedure:            Pre-Anesthesia Assessment:                       - The risks and benefits of the procedure and the                        sedation options and risks were discussed with the                        patient. All questions were answered and informed                        consent was obtained.                       - Patient identification and proposed procedure were                        verified prior to the procedure by the nurse. The                        procedure was verified in the procedure room.                       - ASA Grade Assessment: III - A patient with severe                        systemic disease.                       - After reviewing the risks and benefits, the patient                        was deemed in satisfactory condition to undergo the                        procedure.                       After obtaining informed consent, the endoscope was                        passed under direct vision. Throughout the procedure,                        the patient's blood pressure, pulse, and oxygen  saturations were monitored continuously. The Endoscope                        was introduced through the mouth, and advanced to the                        third part of duodenum. The upper GI endoscopy was                        accomplished without difficulty. The  patient tolerated                        the procedure well. Findings:      The examined esophagus was normal.      Localized moderate inflammation characterized by erosions and erythema       was found in the gastric antrum. Biopsies were taken with a cold forceps       for Helicobacter pylori testing.      The examined duodenum was normal.      The exam was otherwise without abnormality. Impression:           - Normal esophagus.                       - Gastritis. Biopsied.                       - Normal examined duodenum.                       - The examination was otherwise normal. Recommendation:       - Await pathology results.                       - Proceed with colonoscopy Procedure Code(s):    --- Professional ---                       [phone], Esophagogastroduodenoscopy, flexible, transoral;                        with biopsy, single or multiple Diagnosis Code(s):    --- Professional ---                       K50.90, Crohn's disease, unspecified, without                        complications                       R10.84, Generalized abdominal pain                       K29.70, Gastritis, unspecified, without bleeding CPT copyright 2018 American Medical Association. All rights reserved. The codes documented in this report are preliminary and upon coder review may  be revised to meet current compliance requirements. Efrain Sella MD, MD 11/18/2018 10:01:44 AM This report has been signed electronically. Number of Addenda: 0 Note Initiated On: 11/18/2018 9:43 AM      [facility]

## 2018-11-18 NOTE — Interval H&P Note (Signed)
History and Physical Interval Note:  11/18/2018 9:42 AM  [name]  has presented today for surgery, with the diagnosis of CROHNS ABD PAIN DIARRHEA  The various methods of treatment have been discussed with the patient and family. After consideration of risks, benefits and other options for treatment, the patient has consented to  Procedure(s): COLONOSCOPY WITH PROPOFOL (N/A) ESOPHAGOGASTRODUODENOSCOPY (EGD) WITH PROPOFOL (N/A) as a surgical intervention .  The patient's history has been reviewed, patient examined, no change in status, stable for surgery.  I have reviewed the patient's chart and labs.  Questions were answered to the patient's satisfaction.     [place], Martha

## 2018-11-18 NOTE — Anesthesia Post-op Follow-up Note (Signed)
Anesthesia QCDR form completed.        

## 2018-11-18 NOTE — Anesthesia Postprocedure Evaluation (Signed)
Anesthesia Post Note  Patient: [name]  Procedure(s) Performed: COLONOSCOPY WITH PROPOFOL (N/A ) ESOPHAGOGASTRODUODENOSCOPY (EGD) WITH PROPOFOL (N/A )  Patient location during evaluation: PACU Anesthesia Type: General Level of consciousness: awake and alert Pain management: pain level controlled Vital Signs Assessment: post-procedure vital signs reviewed and stable Respiratory status: spontaneous breathing, nonlabored ventilation, respiratory function stable and patient connected to nasal cannula oxygen Cardiovascular status: blood pressure returned to baseline and stable Postop Assessment: no apparent nausea or vomiting Anesthetic complications: no     Last Vitals:  Vitals:   11/18/18 1048 11/18/18 1058  BP: 114/80 122/86  Pulse: 79 81  Resp: (!) 24 20  Temp:    SpO2: 97% 97%    Last Pain:  Vitals:   11/18/18 1058  TempSrc:   PainSc: 0-No pain                 Molli Barrows

## 2018-11-18 NOTE — Anesthesia Procedure Notes (Signed)
Date/Time: 11/18/2018 9:53 AM Performed by: Johnna Acosta, CRNA Pre-anesthesia Checklist: Patient identified, Emergency Drugs available, Suction available, Patient being monitored and Timeout performed Patient Re-evaluated:Patient Re-evaluated prior to induction Oxygen Delivery Method: Nasal cannula Preoxygenation: Pre-oxygenation with 100% oxygen Induction Type: IV induction

## 2018-11-18 NOTE — Op Note (Addendum)
[facility] Gastroenterology Patient Name: [name] Procedure Date: 11/18/2018 9:43 AM MRN: [id number] Account #: [id number] Date of Birth: Dec 28, 1965 Admit Type: Outpatient Age: 53 Room: [facility] ENDO ROOM 2 Gender: Female Note Status: Finalized Procedure:            Colonoscopy Indications:          High risk colon cancer surveillance: Crohn's disease Providers:            Benay Pike. Toledo MD, MD Medicines:            Propofol per Anesthesia Complications:        No immediate complications. Procedure:            Pre-Anesthesia Assessment:                       - The risks and benefits of the procedure and the                        sedation options and risks were discussed with the                        patient. All questions were answered and informed                        consent was obtained.                       - Patient identification and proposed procedure were                        verified prior to the procedure by the nurse. The                        procedure was verified in the procedure room.                       - ASA Grade Assessment: III - A patient with severe                        systemic disease.                       - After reviewing the risks and benefits, the patient                        was deemed in satisfactory condition to undergo the                        procedure.                       After obtaining informed consent, the colonoscope was                        passed under direct vision. Throughout the procedure,                        the patient's blood pressure, pulse, and oxygen                        saturations were monitored continuously. The  Colonoscope was introduced through the anus and                        advanced to the the terminal ileum, with identification                        of the appendiceal orifice and IC valve. The                        colonoscopy was technically  difficult and complex due                        to bowel stenosis. Successful completion of the                        procedure was aided by balloon dilation of the ilececal                        valve and abdominal pressure. The patient tolerated the                        procedure well. The quality of the bowel preparation                        was good. The terminal ileum, ileocecal valve,                        appendiceal orifice, and rectum were photographed. Findings:      The perianal exam findings include internal hemorrhoids that prolapse       with straining, but spontaneously regress to the resting position (Grade       II).      The digital rectal exam was normal. Pertinent negatives include normal       sphincter tone.      The ileocecal valve contained a benign-appearing, intrinsic severe       stenosis measuring less than one cm (in length) x 9 mm (inner diameter)       that was traversed after dilation. A TTS dilator was passed through the       scope. Dilation with a 15 mm colonic balloon dilator was performed. The       dilation site was examined and showed moderate mucosal disruption.       Estimated blood loss was minimal.      A patchy area of mucosa in the terminal ileum was mildly erythematous.       Biopsies were taken with a cold forceps for histology.      The colon (entire examined portion) appeared normal. Four biopsies were       obtained with cold forceps for histology in a targeted manner in the       ascending colon. Biopsies were taken with a cold forceps for histology.       Biopsies were taken with a cold forceps for histology. Four biopsies       were obtained in the sigmoid colon and in the descending colon with cold       forceps for Crohn's disease surveillance.      Normal mucosa was found in the rectum. Biopsies were taken with a cold       forceps for histology.      Non-bleeding  internal hemorrhoids were found during retroflexion. The        hemorrhoids were Grade I (internal hemorrhoids that do not prolapse).      The exam was otherwise without abnormality. Impression:           - Internal hemorrhoids that prolapse with straining,                        but spontaneously regress to the resting position                        (Grade II) found on perianal exam.                       - Stricture at the ileocecal valve. Dilated.                       - Erythematous mucosa in the terminal ileum. Biopsied.                       - The entire examined colon is normal. Biopsied.                       - Normal mucosa in the rectum. Biopsied.                       - Non-bleeding internal hemorrhoids.                       - The examination was otherwise normal.                       - Four biopsies were obtained in the ascending colon.                       - Four biopsies were obtained in the sigmoid colon and                        in the descending colon. Recommendation:       - Patient has a contact number available for                        emergencies. The signs and symptoms of potential                        delayed complications were discussed with the patient.                        Return to normal activities tomorrow. Written discharge                        instructions were provided to the patient.                       - Await pathology results from EGD, also performed                        today.                       - Resume previous diet.                       -  Continue present medications.                       - No aspirin, ibuprofen, naproxen, or other                        non-steroidal anti-inflammatory drugs.                       - Await pathology results.                       - Return to my office in 2 months.                       - The findings and recommendations were discussed with                        the patient and their spouse. Procedure Code(s):    --- Professional ---                        [phone], Colonoscopy, flexible; with transendoscopic                        balloon dilation                       45380, Colonoscopy, flexible; with biopsy, single or                        multiple Diagnosis Code(s):    --- Professional ---                       K63.89, Other specified diseases of intestine                       K50.912, Crohn's disease, unspecified, with intestinal                        obstruction                       K64.1, Second degree hemorrhoids CPT copyright 2018 American Medical Association. All rights reserved. The codes documented in this report are preliminary and upon coder review may  be revised to meet current compliance requirements. Efrain Sella MD, MD 11/18/2018 10:42:10 AM This report has been signed electronically. Number of Addenda: 0 Note Initiated On: 11/18/2018 9:43 AM Scope Withdrawal Time: 0 hours 22 minutes 21 seconds  Total Procedure Duration: 0 hours 28 minutes 37 seconds       [facility]

## 2018-11-18 NOTE — Anesthesia Preprocedure Evaluation (Signed)
Anesthesia Evaluation  Patient identified by MRN, date of birth, ID band Patient awake    Reviewed: Allergy & Precautions, H&P , NPO status , Patient's Chart, lab work & pertinent test results, reviewed documented beta blocker date and time   Airway Mallampati: II   Neck ROM: full    Dental  (+) Poor Dentition   Pulmonary COPD, Current Smoker,    Pulmonary exam normal        Cardiovascular negative cardio ROS Normal cardiovascular exam Rhythm:regular Rate:Normal     Neuro/Psych PSYCHIATRIC DISORDERS Depression negative neurological ROS     GI/Hepatic negative GI ROS, Neg liver ROS,   Endo/Other  negative endocrine ROS  Renal/GU negative Renal ROS  negative genitourinary   Musculoskeletal   Abdominal   Peds  Hematology negative hematology ROS (+)   Anesthesia Other Findings Past Medical History: No date: COPD (chronic obstructive pulmonary disease) (HCC) No date: Crohn's disease ([place]) No date: Depression No date: History of colon polyps Past Surgical History: No date: ABDOMINAL HYSTERECTOMY No date: CHOLECYSTECTOMY No date: EYE SURGERY   Reproductive/Obstetrics negative OB ROS                             Anesthesia Physical Anesthesia Plan  ASA: II  Anesthesia Plan: General   Post-op Pain Management:    Induction:   PONV Risk Score and Plan:   Airway Management Planned:   Additional Equipment:   Intra-op Plan:   Post-operative Plan:   Informed Consent: I have reviewed the patients History and Physical, chart, labs and discussed the procedure including the risks, benefits and alternatives for the proposed anesthesia with the patient or authorized representative who has indicated his/her understanding and acceptance.   Dental Advisory Given  Plan Discussed with: CRNA  Anesthesia Plan Comments:         Anesthesia Quick Evaluation

## 2018-11-18 NOTE — H&P (Signed)
Outpatient short stay form Pre-procedure 11/18/2018 9:36 AM [name], M.D.  Primary Physician: Dr. Brunetta Genera  Reason for visit:  Abdominal pain, chronic diarrhea, Crohn's disease  History of present illness:  As above. No weight loss. Was in the hospital in October 2019 with norovirus.  Hx colonoscopy 2015 with Dr. Allen Norris showing one polyp and intrinsic, severe stenosis at IC valve. EGD 2015 also by Dr. Allen Norris showed H Pylori neg gastritis and duodenitis without evidence of celiac disease or crohn's findings.   No current facility-administered medications for this encounter.   Medications Prior to Admission  Medication Sig Dispense Refill Last Dose  . albuterol (PROVENTIL HFA;VENTOLIN HFA) 108 (90 Base) MCG/ACT inhaler Inhale 2 puffs into the lungs every 4 (four) hours as needed for wheezing or shortness of breath.   11/17/2018  . dicyclomine (BENTYL) 10 MG capsule Take 10 mg by mouth 3 (three) times daily as needed for cramping.   08/01/2018 at 0700  . FLUoxetine (PROZAC) 40 MG capsule Take 40 mg by mouth daily.   07/31/2018 at 0700  . Fluticasone Propionate, Inhal, (FLOVENT IN) Inhale 2 puffs into the lungs daily.   Past Week at Unknown time  . hydrOXYzine (ATARAX/VISTARIL) 25 MG tablet Take 25 mg by mouth at bedtime.   07/31/2018 at 2100  . nicotine (NICODERM CQ - DOSED IN MG/24 HOURS) 14 mg/24hr patch One patch daily (can substitute generic patch) 28 patch 0   . pantoprazole (PROTONIX) 40 MG tablet Take 40 mg by mouth daily.   08/01/2018 at 0700     No Known Allergies   Past Medical History:  Diagnosis Date  . COPD (chronic obstructive pulmonary disease) ([place])   . Crohn's disease ([place])   . Depression   . History of colon polyps     Review of systems:  Otherwise negative.    Physical Exam  Gen: Alert, oriented. Appears stated age.  HEENT: [place]/AT. PERRLA. Lungs: CTA, no wheezes. CV: RR nl S1, S2. Abd: soft, benign, no masses. BS+ Ext: No edema. Pulses 2+    Planned  procedures: Proceed with EGD and colonoscopy. The patient understands the nature of the planned procedure, indications, risks, alternatives and potential complications including but not limited to bleeding, infection, perforation, damage to internal organs and possible oversedation/side effects from anesthesia. The patient agrees and gives consent to proceed.  Please refer to procedure notes for findings, recommendations and patient disposition/instructions.     [name], M.D. Gastroenterology 11/18/2018  9:36 AM

## 2018-11-18 NOTE — Transfer of Care (Signed)
Immediate Anesthesia Transfer of Care Note  Patient: [name]  Procedure(s) Performed: COLONOSCOPY WITH PROPOFOL (N/A ) ESOPHAGOGASTRODUODENOSCOPY (EGD) WITH PROPOFOL (N/A )  Patient Location: PACU  Anesthesia Type:General  Level of Consciousness: sedated  Airway & Oxygen Therapy: Patient Spontanous Breathing and Patient connected to nasal cannula oxygen  Post-op Assessment: Report given to RN and Post -op Vital signs reviewed and stable  Post vital signs: Reviewed and stable  Last Vitals:  Vitals Value Taken Time  BP 96/74 11/18/2018 10:38 AM  Temp 36.1 C 11/18/2018 10:38 AM  Pulse 79 11/18/2018 10:41 AM  Resp 17 11/18/2018 10:41 AM  SpO2 94 % 11/18/2018 10:41 AM  Vitals shown include unvalidated device data.  Last Pain:  Vitals:   11/18/18 1038  TempSrc: Tympanic  PainSc: Asleep         Complications: No apparent anesthesia complications

## 2018-11-19 ENCOUNTER — Encounter: Payer: Self-pay | Admitting: Internal Medicine

## 2018-11-19 LAB — SURGICAL PATHOLOGY

## 2019-01-03 ENCOUNTER — Emergency Department
Admission: EM | Admit: 2019-01-03 | Discharge: 2019-01-04 | Disposition: A | Discharge status: Home / Self Care | Payer: Medicaid Other | Attending: Emergency Medicine | Admitting: Emergency Medicine

## 2019-01-03 ENCOUNTER — Encounter: Payer: Self-pay | Admitting: Emergency Medicine

## 2019-01-03 DIAGNOSIS — K5090 Crohn's disease, unspecified, without complications: Secondary | ICD-10-CM | POA: Diagnosis not present

## 2019-01-03 DIAGNOSIS — Z79899 Other long term (current) drug therapy: Secondary | ICD-10-CM | POA: Diagnosis not present

## 2019-01-03 DIAGNOSIS — F17210 Nicotine dependence, cigarettes, uncomplicated: Secondary | ICD-10-CM | POA: Diagnosis not present

## 2019-01-03 DIAGNOSIS — R1084 Generalized abdominal pain: Secondary | ICD-10-CM | POA: Diagnosis not present

## 2019-01-03 DIAGNOSIS — R109 Unspecified abdominal pain: Secondary | ICD-10-CM | POA: Diagnosis present

## 2019-01-03 MED ORDER — SODIUM CHLORIDE 0.9% FLUSH: 3.0000 mL | Freq: Once | INTRAVENOUS | Status: DC

## 2019-01-03 NOTE — ED Triage Notes (Signed)
Pt to ED with c/o of abdominal pain that started today and has increased. Pt has hx of crohns. Pt states 1 occurrence of emesis and c/o bloating.

## 2019-01-03 NOTE — ED Provider Notes (Signed)
[facility] Emergency Department Provider Note  ____________________________________________   I have reviewed the triage vital signs and the nursing notes.   HISTORY  Chief Complaint Abdominal Pain   History limited by: Not Limited   HPI [name] is a 53 y.o. female who presents to the emergency department today because of concerns for possible Crohn's flare.  Patient states she has a history of Crohn's disease.  Starting today she developed abdominal pain.  Is located primarily in the lower abdomen.  The patient states this has been accompanied by vomiting.  The patient denies any unusual ingestions recently.  Is been worked up with gastroenterology and underwent colonoscopy in January.  She has not yet had a follow-up appointment from that visit.  She states she currently does not have any Crohn's medications at home.  Per medical record review patient has a history of recent colonoscopy.  Past Medical History:  Diagnosis Date  . COPD (chronic obstructive pulmonary disease) ([place])   . Crohn's disease ([place])   . Depression   . History of colon polyps     Patient Active Problem List   Diagnosis Date Noted  . Exacerbation of Crohn's disease ([place]) 08/01/2018    Past Surgical History:  Procedure Laterality Date  . ABDOMINAL HYSTERECTOMY    . CHOLECYSTECTOMY    . COLONOSCOPY WITH PROPOFOL N/A 11/18/2018   Procedure: COLONOSCOPY WITH PROPOFOL;  Surgeon: Toledo, Benay Pike, MD;  Location: [facility] ENDOSCOPY;  Service: Gastroenterology;  Laterality: N/A;  . ESOPHAGOGASTRODUODENOSCOPY (EGD) WITH PROPOFOL N/A 11/18/2018   Procedure: ESOPHAGOGASTRODUODENOSCOPY (EGD) WITH PROPOFOL;  Surgeon: Toledo, Benay Pike, MD;  Location: [facility] ENDOSCOPY;  Service: Gastroenterology;  Laterality: N/A;  . EYE SURGERY      Prior to Admission medications   Medication Sig Start Date End Date Taking? Authorizing Provider  albuterol (PROVENTIL HFA;VENTOLIN HFA) 108 (90 Base)  MCG/ACT inhaler Inhale 2 puffs into the lungs every 4 (four) hours as needed for wheezing or shortness of breath.    [provider]  dicyclomine (BENTYL) 10 MG capsule Take 10 mg by mouth 3 (three) times daily as needed for cramping. 07/21/18 07/21/19  [provider]  FLUoxetine (PROZAC) 40 MG capsule Take 40 mg by mouth daily.    [provider]  Fluticasone Propionate, Inhal, (FLOVENT IN) Inhale 2 puffs into the lungs daily.    [provider]  hydrOXYzine (ATARAX/VISTARIL) 25 MG tablet Take 25 mg by mouth at bedtime.    [provider]  nicotine (NICODERM CQ - DOSED IN MG/24 HOURS) 14 mg/24hr patch One patch daily (can substitute generic patch) 08/03/18   Leslye Peer, Richard, MD  pantoprazole (PROTONIX) 40 MG tablet Take 40 mg by mouth daily. 07/21/18   [provider]    Allergies Patient has no known allergies.  History reviewed. No pertinent family history.  Social History Social History   Tobacco Use  . Smoking status: Current Every Day Smoker    Packs/day: 1.00    Types: Cigarettes  . Smokeless tobacco: Never Used  Substance Use Topics  . Alcohol use: No  . Drug use: No    Review of Systems Constitutional: No fever/chills Eyes: No visual changes. ENT: No sore throat. Cardiovascular: Denies chest pain. Respiratory: Denies shortness of breath. Gastrointestinal: Positive for abdominal pain and emesis. Positive for diarrhea.  Genitourinary: Negative for dysuria. Musculoskeletal: Negative for back pain. Skin: Negative for rash. Neurological: Negative for headaches, focal weakness or numbness.  ____________________________________________   PHYSICAL EXAM:  VITAL SIGNS: ED Triage Vitals  Enc Vitals Group     BP 01/03/19 2351 128/87     Pulse Rate 01/03/19 2351 89     Resp 01/03/19 2351 18     Temp 01/03/19 2351 97.6 F (36.4 C)     Temp Source 01/03/19 2351 Oral     SpO2 01/03/19 2351 98 %     Weight 01/03/19 2352  153 lb (69.4 kg)     Height 01/03/19 2352 5' (1.524 m)     Head Circumference --      Peak Flow --      Pain Score 01/03/19 2351 8   Constitutional: Alert and oriented.  Eyes: Conjunctivae are normal.  ENT      Head: Normocephalic and atraumatic.      Nose: No congestion/rhinnorhea.      Mouth/Throat: Mucous membranes are moist.      Neck: No stridor. Hematological/Lymphatic/Immunilogical: No cervical lymphadenopathy. Cardiovascular: Normal rate, regular rhythm.  No murmurs, rubs, or gallops.  Respiratory: Normal respiratory effort without tachypnea nor retractions. Breath sounds are clear and equal bilaterally. No wheezes/rales/rhonchi. Gastrointestinal: Soft and somewhat diffusely tender to palpation. Genitourinary: Deferred Musculoskeletal: Normal range of motion in all extremities. No lower extremity edema. Neurologic:  Normal speech and language. No gross focal neurologic deficits are appreciated.  Skin:  Skin is warm, dry and intact. No rash noted. Psychiatric: Mood and affect are normal. Speech and behavior are normal. Patient exhibits appropriate insight and judgment.  ____________________________________________    LABS (pertinent positives/negatives)  Lipase 38 CBC wbc 16.4, hgb 15.7, plt 354 CMP wnl except glu 135  ____________________________________________   EKG  I, Nance Pear, attending physician, personally viewed and interpreted this EKG  EKG Time: 2355 Rate: 84 Rhythm: sinus rhythm Axis: normal Intervals: qtc 473 QRS: narrow ST changes: no st elevation Impression: normal ekg ____________________________________________    RADIOLOGY  None  ____________________________________________   PROCEDURES  Procedures  ____________________________________________   INITIAL IMPRESSION / ASSESSMENT AND PLAN / ED COURSE  Pertinent labs & imaging results that were available during my care of the patient were reviewed by me and considered in my  medical decision making (see chart for details).   Patient presented to the emergency department today with concerns for possible pains disease exacerbation.  Patient did have some generalized abdominal pain.  Per chart review patient had recent admission for gastroparesis flare however was found to have a stomach bug.  She has had elevated white count in the past with this and today it was elevated again.  Patient's pain is not localized in the right lower quadrant.  At this point I doubt appendicitis.  She did feel better after IV fluids and medication.  At this point I think processes or gastroenteritis unlikely.  I discussed this with the patient.  She felt comfortable deferring imaging at this time which I think is reasonable given lack of fever.  Discussed with patient importance of GI follow-up.  Will give patient medications to help with her symptoms.  ____________________________________________   FINAL CLINICAL IMPRESSION(S) / ED DIAGNOSES  Final diagnoses:  Generalized abdominal pain     Note: This dictation was prepared with Dragon dictation. Any transcriptional errors that result from this process are unintentional     Nance Pear, MD 01/04/19 [phone]

## 2019-01-04 ENCOUNTER — Other Ambulatory Visit: Payer: Self-pay

## 2019-01-04 ENCOUNTER — Telehealth: Payer: Self-pay | Admitting: Gastroenterology

## 2019-01-04 ENCOUNTER — Encounter: Payer: Self-pay | Admitting: Emergency Medicine

## 2019-01-04 ENCOUNTER — Emergency Department
Admission: EM | Admit: 2019-01-04 | Discharge: 2019-01-04 | Disposition: A | Discharge status: Home / Self Care | Payer: Medicaid Other | Source: Home / Self Care

## 2019-01-04 DIAGNOSIS — R109 Unspecified abdominal pain: Secondary | ICD-10-CM | POA: Insufficient documentation

## 2019-01-04 DIAGNOSIS — Z5321 Procedure and treatment not carried out due to patient leaving prior to being seen by health care provider: Secondary | ICD-10-CM | POA: Insufficient documentation

## 2019-01-04 LAB — COMPREHENSIVE METABOLIC PANEL
ALT: 12 U/L (ref 0–44)
ALT: 15 U/L (ref 0–44)
AST: 15 U/L (ref 15–41)
AST: 19 U/L (ref 15–41)
Albumin: 3.7 g/dL (ref 3.5–5.0)
Albumin: 4.2 g/dL (ref 3.5–5.0)
Alkaline Phosphatase: 114 U/L (ref 38–126)
Alkaline Phosphatase: 95 U/L (ref 38–126)
Anion gap: 10 (ref 5–15)
Anion gap: 8 (ref 5–15)
BUN: 10 mg/dL (ref 6–20)
BUN: 10 mg/dL (ref 6–20)
CO2: 22 mmol/L (ref 22–32)
CO2: 25 mmol/L (ref 22–32)
Calcium: 8.8 mg/dL — ABNORMAL LOW (ref 8.9–10.3)
Calcium: 9.4 mg/dL (ref 8.9–10.3)
Chloride: 104 mmol/L (ref 98–111)
Chloride: 108 mmol/L (ref 98–111)
Creatinine, Ser: 0.59 mg/dL (ref 0.44–1.00)
Creatinine, Ser: 0.62 mg/dL (ref 0.44–1.00)
GFR calc Af Amer: >60 mL/min (ref >60)
GFR calc Af Amer: >60 mL/min (ref >60)
GFR calc non Af Amer: >60 mL/min (ref >60)
GFR calc non Af Amer: >60 mL/min (ref >60)
Glucose, Bld: 113 mg/dL — ABNORMAL HIGH (ref 70–99)
Glucose, Bld: 135 mg/dL — ABNORMAL HIGH (ref 70–99)
Potassium: 4 mmol/L (ref 3.5–5.1)
Potassium: 4.3 mmol/L (ref 3.5–5.1)
SODIUM: 137 mmol/L (ref 135–145)
Sodium: 140 mmol/L (ref 135–145)
Total Bilirubin: 0.3 mg/dL (ref 0.3–1.2)
Total Bilirubin: 0.4 mg/dL (ref 0.3–1.2)
Total Protein: 7 g/dL (ref 6.5–8.1)
Total Protein: 7.7 g/dL (ref 6.5–8.1)

## 2019-01-04 LAB — CBC
HCT: 48.8 % — ABNORMAL HIGH (ref 36.0–46.0)
HEMATOCRIT: 47.3 % — AB (ref 36.0–46.0)
Hemoglobin: 15 g/dL (ref 12.0–15.0)
Hemoglobin: 15.7 g/dL — ABNORMAL HIGH (ref 12.0–15.0)
MCH: 27.8 pg (ref 26.0–34.0)
MCH: 28 pg (ref 26.0–34.0)
MCHC: 31.7 g/dL (ref 30.0–36.0)
MCHC: 32.2 g/dL (ref 30.0–36.0)
MCV: 86.5 fL (ref 80.0–100.0)
MCV: 88.4 fL (ref 80.0–100.0)
Platelets: 353 10*3/uL (ref 150–400)
Platelets: 354 10*3/uL (ref 150–400)
RBC: 5.35 MIL/uL — ABNORMAL HIGH (ref 3.87–5.11)
RBC: 5.64 MIL/uL — ABNORMAL HIGH (ref 3.87–5.11)
RDW: 13.6 % (ref 11.5–15.5)
RDW: 13.7 % (ref 11.5–15.5)
WBC: 13.1 10*3/uL — AB (ref 4.0–10.5)
WBC: 16.4 10*3/uL — ABNORMAL HIGH (ref 4.0–10.5)
nRBC: 0 % (ref 0.0–0.2)
nRBC: 0 % (ref 0.0–0.2)

## 2019-01-04 LAB — LIPASE, BLOOD
Lipase: 33 U/L (ref 11–51)
Lipase: 38 U/L (ref 11–51)

## 2019-01-04 MED ORDER — SUCRALFATE 1 G PO TABS: 1.0000 g | ORAL_TABLET | Freq: Four times a day (QID) | ORAL | 0 refills | Status: DC

## 2019-01-04 MED ORDER — ONDANSETRON HCL 4 MG PO TABS: 4.0000 mg | ORAL_TABLET | Freq: Three times a day (TID) | ORAL | 0 refills | Status: DC | PRN

## 2019-01-04 MED ORDER — HALOPERIDOL LACTATE 5 MG/ML IJ SOLN
5.0000 mg | Freq: Once | INTRAMUSCULAR | Status: AC
  Administered 2019-01-04: 5 mg via INTRAVENOUS
  Filled 2019-01-04: qty 1

## 2019-01-04 MED ORDER — DICYCLOMINE HCL 20 MG PO TABS: 20.0000 mg | ORAL_TABLET | Freq: Three times a day (TID) | ORAL | 0 refills | Status: DC | PRN

## 2019-01-04 MED ORDER — SODIUM CHLORIDE 0.9 % IV BOLUS
1000.0000 mL | Freq: Once | INTRAVENOUS | Status: AC
  Administered 2019-01-04: 1000 mL via INTRAVENOUS

## 2019-01-04 NOTE — Discharge Instructions (Addendum)
Please return for any pain that localizes (or becomes stronger) in the right lower quadrant. Please seek medical attention for any high fevers, chest pain, shortness of breath, change in behavior, persistent vomiting, bloody stool or any other new or concerning symptoms.

## 2019-01-04 NOTE — Telephone Encounter (Signed)
Patients husband called via [facility] answering service with complaint of wife having abdominal pain.  Patient had gone to the ER [facility] last night and was evaluated by ER MD.  Labs indicated a mild leukocytosis. No imaging done.  Patient was afebrile with  At that time mild abdominal pain.  She was released.  Call this am with complaint the patient was still sick.  I had a long discussion with patient. Currently no fever or chills, mild to no nausea, last emesis last night before ER visit.  Last bm this am, watery brown.  Earlier emesis showed no blood. Patient complains  Of upper abdominal bloating, with discomfort just above the umbilicus, now a 6/06 compared to when she went to the ER.  Patient was possibly syncopal prior to going to ER x 2, husband states she had a seizure, with no prior history of seizure. This has not been repeated.    Chart review indicates a know history of Crohn's disease, has not been on maintenance for at least 6 months, most recent colonoscopy 11/18/2018 with dilation of a stenosis at the IC valve/TI.  Possible  small bowel disease on CT 08/03/18 and disease at the TI on SBS 09/08/18.  Colonoscopy 11/18/2018 biopsies not consistant with Crohn's or active ileitis. Marland Kitchen

## 2019-01-04 NOTE — ED Triage Notes (Signed)
Pt arrives with complaints of sharp/achning abdominal pain that started last night. Pt was seen last night and diagnosed with a chron's flare. Pt states "today it is worse." In triage pt a&o x 4.

## 2019-01-04 NOTE — Telephone Encounter (Signed)
Continuation of note:  Patient is feeling better than last night.  I gave her options of returning to the ER or going to the [facility] acute care (not open until 1 pm), and I would call the facility of choice to facilitate evaluation. Patient stated she wanted to go to acute care when it opened.  I told her to go directly  to the er if she had fever, increased pin or chills, any other symptoms before then.  Patients called me back on the call phone expressing still unhappy with management at er, stating he had told intake nurse about "seizures" and was unable to discuss further with staff due to d/c.  I again told patient husband to take her back to the ER, I would call ahead.  Husband stated he would have to do several things at his home before leaving, perhaps an hour and a half.  I will call er and discuss with staff.

## 2019-01-04 NOTE — ED Notes (Signed)
First RN Note: Pt presents to ED via POV with her husband, per husband pt was seen last night and D/C with Crohn's flare up after being brought in by EMS. Pt's husband presents to ED today disgruntled that patient was not evaluated for seizures. Pt reports seizure-like activity lasting approx 5 mins each, 2 episodes within 30 mins. Pt presents to ED with c/o Crohn's flare up and abdominal pain, does not mention seizure-like activity, states continued pain at this time.

## 2019-01-04 NOTE — ED Notes (Signed)
Patient educated about not driving or performing other critical tasks (such as operating heavy machinery, caring for infant/toddler/child) due to sedative nature of narcotic medications received while in the ED.  Pt/caregiver verbalized understanding. Pt states her spouse will be coming to pick her up.

## 2019-01-04 NOTE — ED Notes (Signed)
Called for room x 2.

## 2019-01-04 NOTE — ED Notes (Signed)
Called for room x 1.

## 2019-08-03 ENCOUNTER — Other Ambulatory Visit: Payer: Self-pay | Admitting: Gastroenterology

## 2019-08-03 DIAGNOSIS — R112 Nausea with vomiting, unspecified: Secondary | ICD-10-CM

## 2019-08-04 ENCOUNTER — Other Ambulatory Visit: Payer: Self-pay

## 2019-08-04 ENCOUNTER — Ambulatory Visit
Admission: RE | Admit: 2019-08-04 | Discharge: 2019-08-04 | Disposition: A | Discharge status: Home / Self Care | Payer: Medicaid Other | Source: Ambulatory Visit | Attending: Gastroenterology | Admitting: Gastroenterology

## 2019-08-04 DIAGNOSIS — R112 Nausea with vomiting, unspecified: Secondary | ICD-10-CM | POA: Diagnosis present

## 2019-08-04 HISTORY — DX: Unspecified asthma, uncomplicated: J45.909

## 2019-08-04 MED ORDER — IOHEXOL 300 MG/ML  SOLN
100.0000 mL | Freq: Once | INTRAMUSCULAR | Status: AC | PRN
  Administered 2019-08-04: 11:00:00 100 mL via INTRAVENOUS

## 2019-08-05 MED ORDER — TICLOPIDINE HCL PO
5000.00 | ORAL | Status: DC
Start: 2019-08-05 — End: 2019-08-05

## 2019-08-05 MED ORDER — GENERIC EXTERNAL MEDICATION
Status: DC
Start: ? — End: 2019-08-05

## 2019-08-05 MED ORDER — BARO-CAT PO
100.00 | ORAL | Status: DC
Start: ? — End: 2019-08-05

## 2019-08-05 MED ORDER — POTASSIUM IODIDE 32.5 (24 I) MG PO TABS
40.00 | ORAL_TABLET | ORAL | Status: DC
Start: 2019-08-05 — End: 2019-08-05

## 2019-08-05 MED ORDER — DESMOPRESSIN ACE SPRAY REFRIG
20.00 | Status: DC
Start: 2019-08-05 — End: 2019-08-05

## 2019-08-05 MED ORDER — INFLUENZA VAC SPLIT QUAD 0.5 ML IM SUSY
0.50 | PREFILLED_SYRINGE | INTRAMUSCULAR | Status: DC
Start: ? — End: 2019-08-05

## 2019-08-05 MED ORDER — ACETAMINOPHEN 325 MG PO TABS
650.00 | ORAL_TABLET | ORAL | Status: DC
Start: ? — End: 2019-08-05

## 2019-08-05 MED ORDER — ACCU-PRO PUMP SET/VENT MISC
2.50 | Status: DC
Start: ? — End: 2019-08-05

## 2019-08-05 MED ORDER — Medication
3.38 | Status: DC
Start: 2019-08-05 — End: 2019-08-05

## 2019-08-26 IMAGING — CT CT ABD-PELV W/ CM
2 of 5 series · 16 of 46 positions shown, 18 images · IV contrast (iopamidol)
Comparison: February 16, 2015

CLINICAL DATA: Abdominal bloating for 3 months. History of Crohn's
disease

EXAM:
CT ABDOMEN AND PELVIS WITH CONTRAST
TECHNIQUE: Multidetector CT imaging of the abdomen and pelvis was performed
using the standard protocol following bolus administration of
intravenous contrast.
CONTRAST:  100mL SK3ORH-LAA IOPAMIDOL (SK3ORH-LAA) INJECTION 61%

[Series 2: abd pelvis · axial · 0.68mm/px · z∈[-1554,-1169]mm · 13 of 87 slices shown, 15 images (1 of 2)]
[im 5/87  soft-tissue]
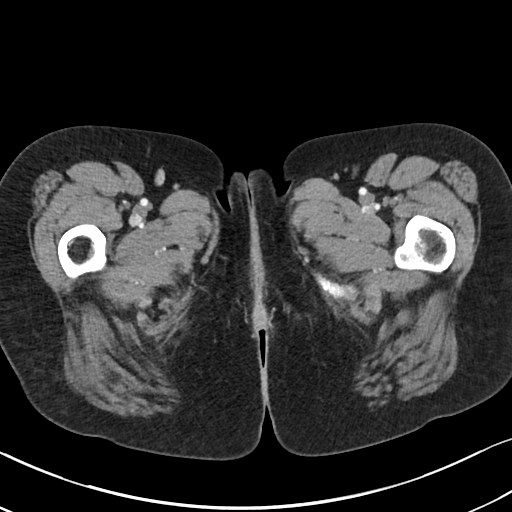
[im 5/87  bone]
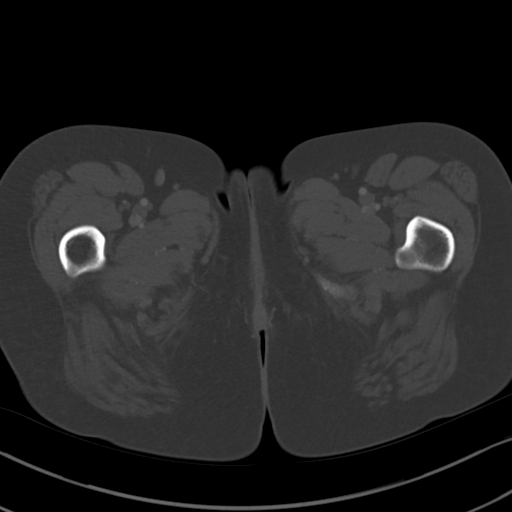
[im 10/87  soft-tissue]
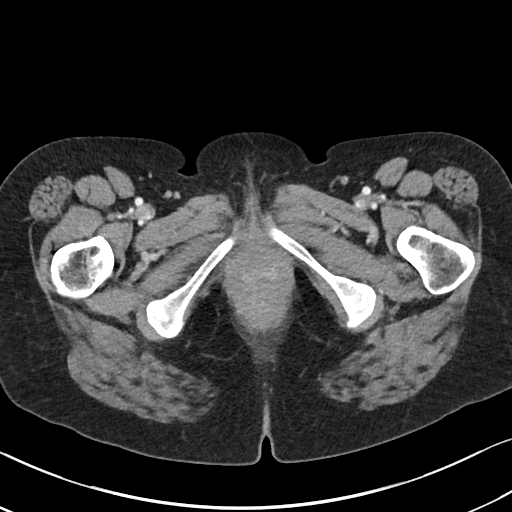
[im 20/87  soft-tissue]
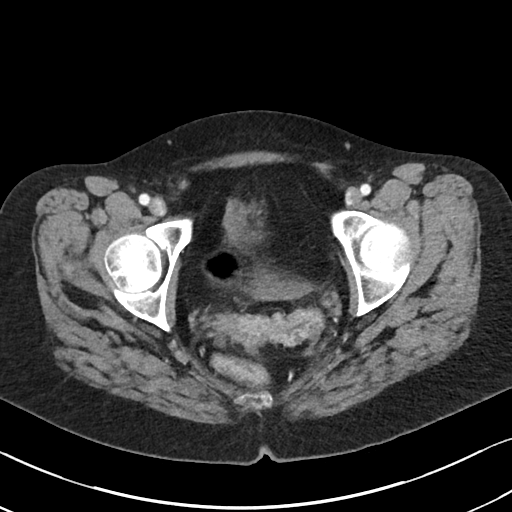
[im 24/87  soft-tissue]
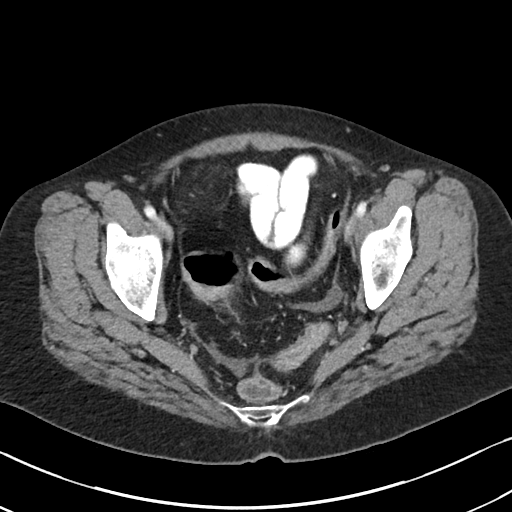
[im 29/87  soft-tissue]
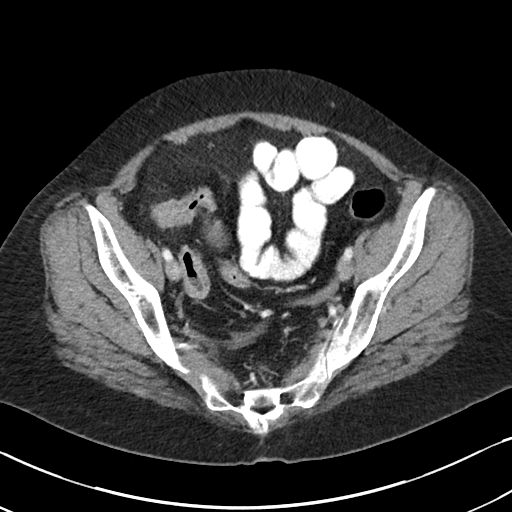
[im 39/87  soft-tissue]
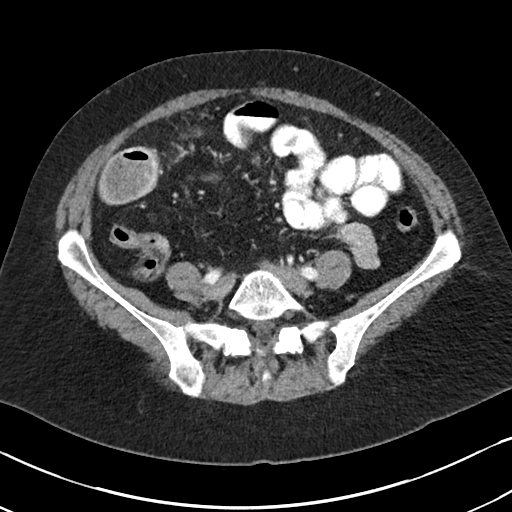
[im 44/87  soft-tissue]
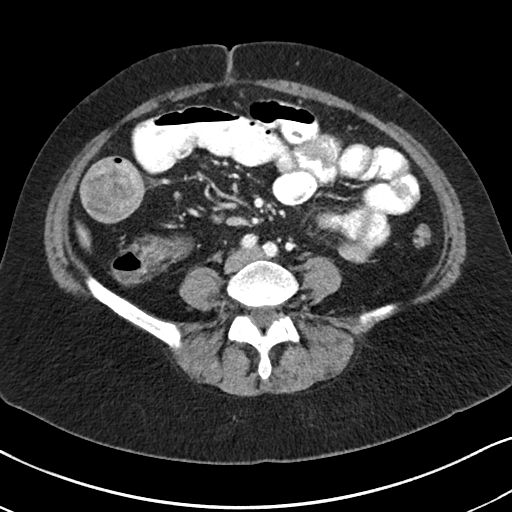
[im 48/87  soft-tissue]
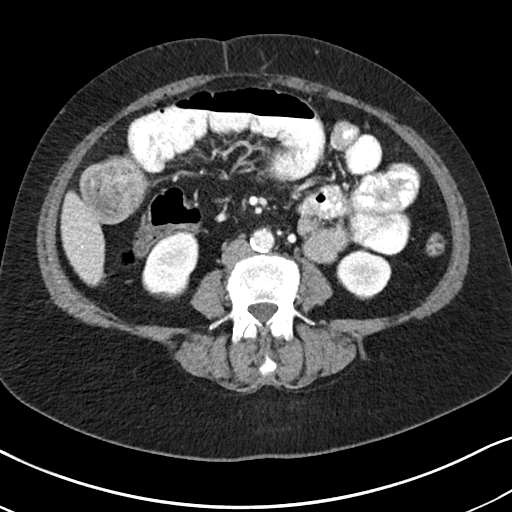
[im 58/87  soft-tissue]
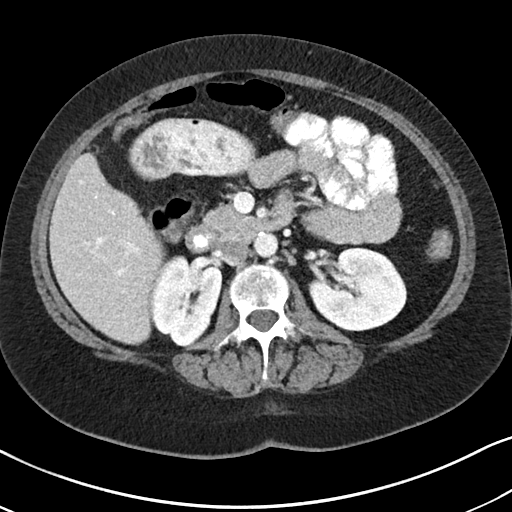
[im 58/87  bone]
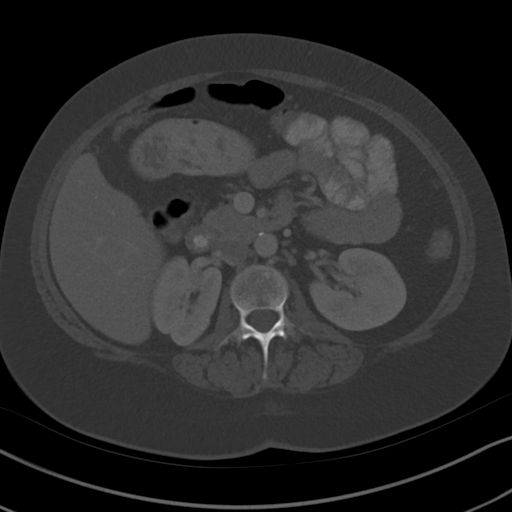
[im 63/87  soft-tissue]
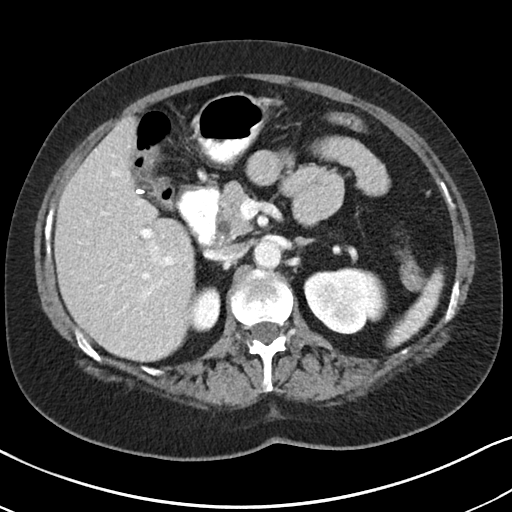
[im 67/87  soft-tissue]
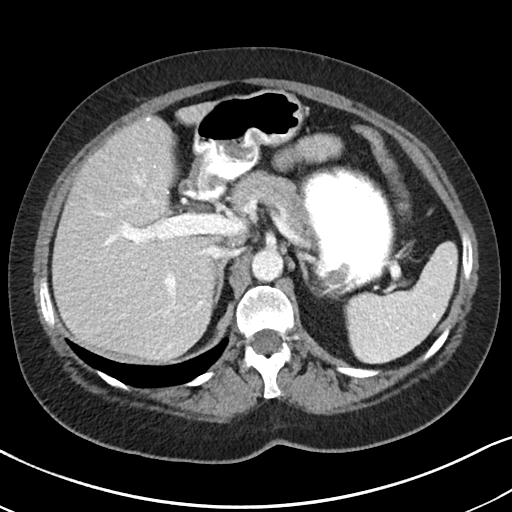
[im 77/87  soft-tissue]
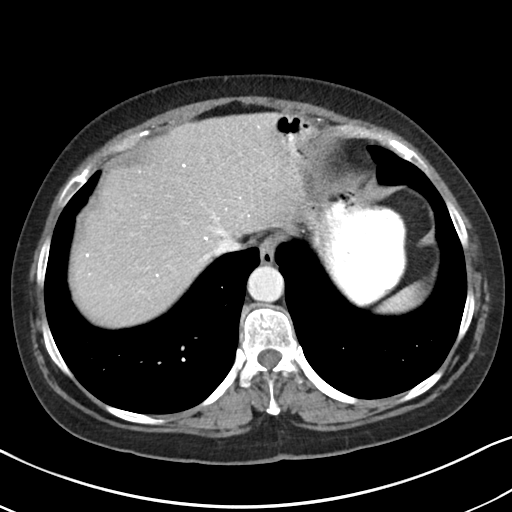
[im 82/87  soft-tissue]
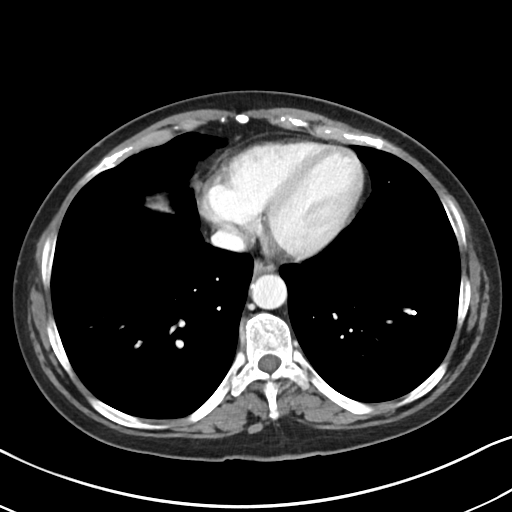

[Series 4: abd pelvis · coronal · 0.68mm/px · 3 of 154 slices shown (2 of 2)]
[im 52/154  soft-tissue]
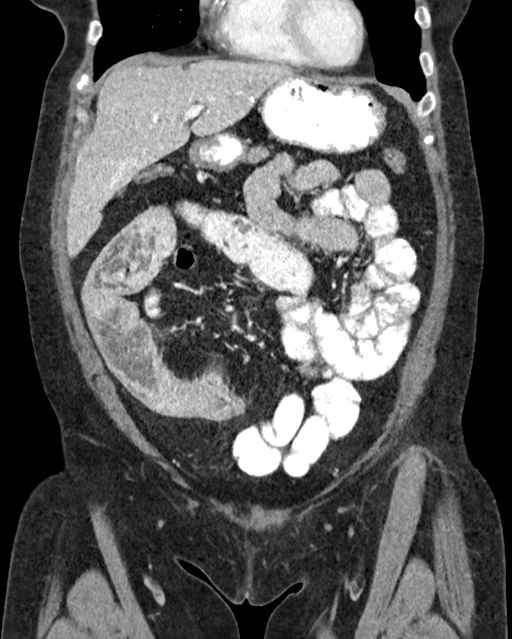
[im 69/154  soft-tissue]
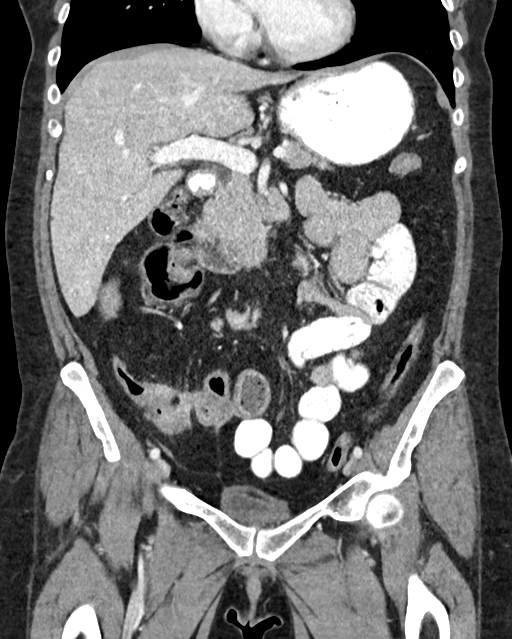
[im 86/154  soft-tissue]
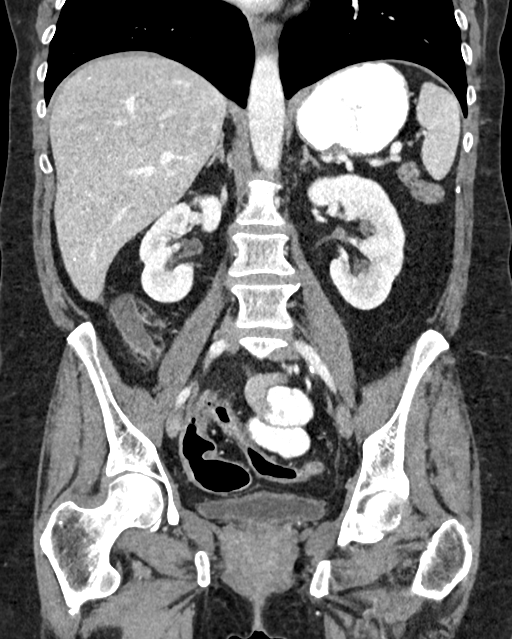

[16 of 46 positions shown; findings below may reference images not displayed]

FINDINGS: Lower chest: Mild patchy consolidation of the right lower lobe is
identified suspicious for developing pneumonia. The left lung is
clear. The heart size is normal.

Hepatobiliary: Calcified granulomas identified throughout liver. No
other focal liver abnormality is seen. Status post cholecystectomy.
No biliary dilatation.

Pancreas: Unremarkable. No pancreatic ductal dilatation or
surrounding inflammatory changes.

Spleen: Normal in size without focal abnormality. Calcified
granulomas are identified in the spleen.

Adrenals/Urinary Tract: The bilateral adrenal glands are normal.
There is a tiny cyst in the left kidney. The right kidney is normal.
There is no hydronephrosis bilaterally. The bladder is normal.

Stomach/Bowel: There are thick walled dilated small bowel loops in
the right lower quadrant with mild-to-moderate dilatation of the
more proximal and mid small bowel loops. There is probable thick
walled rectal sigmoid colon with mild surrounding stranding. The
stomach is normal.

Vascular/Lymphatic: Aortic atherosclerosis. No enlarged abdominal or
pelvic lymph nodes.

Reproductive: Status post hysterectomy. No adnexal masses.

Other: None.

Musculoskeletal: No acute abnormality noted.
IMPRESSION: Findings consistent with Crohn's disease with areas of involvement
including distal small bowel loops and possibly rectal sigmoid
colon. There is a functional small-bowel obstruction of the mid and
proximal small bowel loops due to inflammatory changes of distal
small bowel loops.

## 2020-09-16 ENCOUNTER — Other Ambulatory Visit: Payer: Medicaid Other

## 2020-09-20 ENCOUNTER — Encounter: Admission: RE | Payer: Self-pay | Source: Home / Self Care

## 2020-09-20 ENCOUNTER — Ambulatory Visit: Admission: RE | Admit: 2020-09-20 | Payer: Medicaid Other | Source: Home / Self Care

## 2020-09-20 SURGERY — COLONOSCOPY WITH PROPOFOL
Anesthesia: General

## 2022-10-03 ENCOUNTER — Emergency Department
Admission: EM | Admit: 2022-10-03 | Discharge: 2022-10-03 | Disposition: A | Discharge status: Home / Self Care | Payer: Medicaid Other | Attending: Emergency Medicine | Admitting: Emergency Medicine

## 2022-10-03 ENCOUNTER — Other Ambulatory Visit: Payer: Self-pay

## 2022-10-03 DIAGNOSIS — L02415 Cutaneous abscess of right lower limb: Secondary | ICD-10-CM | POA: Diagnosis not present

## 2022-10-03 DIAGNOSIS — J449 Chronic obstructive pulmonary disease, unspecified: Secondary | ICD-10-CM | POA: Diagnosis not present

## 2022-10-03 DIAGNOSIS — M79661 Pain in right lower leg: Secondary | ICD-10-CM | POA: Diagnosis present

## 2022-10-03 DIAGNOSIS — L02419 Cutaneous abscess of limb, unspecified: Secondary | ICD-10-CM

## 2022-10-03 MED ORDER — CEPHALEXIN 500 MG PO CAPS: 500.0000 mg | ORAL_CAPSULE | Freq: Four times a day (QID) | ORAL | 0 refills | Status: AC

## 2022-10-03 MED ORDER — HYDROCODONE-ACETAMINOPHEN 5-325 MG PO TABS: 1.0000 | ORAL_TABLET | ORAL | 0 refills | Status: DC | PRN

## 2022-10-03 MED ORDER — LIDOCAINE HCL (PF) 1 % IJ SOLN
5.0000 mL | Freq: Once | INTRAMUSCULAR | Status: AC
  Administered 2022-10-03: 5 mL via INTRADERMAL
  Filled 2022-10-03: qty 5

## 2022-10-03 MED ORDER — LIDOCAINE-EPINEPHRINE-TETRACAINE (LET) TOPICAL GEL
3.0000 mL | Freq: Once | TOPICAL | Status: AC
  Administered 2022-10-03: 3 mL via TOPICAL
  Filled 2022-10-03: qty 3

## 2022-10-03 NOTE — ED Provider Notes (Signed)
[facility] Provider Note    Event Date/Time   First MD Initiated Contact with Patient 10/03/22 1039     (approximate)   History   Leg Pain and Insect Bite   HPI  [name] is a 56 y.o. female with history of COPD, depression, Crohn's presents emergency department with questionable spider bites to the right lower leg.  Patient states that her daughter put a pin in it to open it and got yellow pus.  Now the area has become more swollen and painful.  A lot of redness surrounding the area.  States became painful to walk due to the amount of swelling and pain.  She denies fever or chills.      Physical Exam   Triage Vital Signs: ED Triage Vitals  Enc Vitals Group     BP 10/03/22 0942 139/89     Pulse Rate 10/03/22 0942 94     Resp 10/03/22 0942 18     Temp 10/03/22 0942 98.3 F (36.8 C)     Temp Source 10/03/22 0942 Oral     SpO2 10/03/22 0942 96 %     Weight 10/03/22 0943 130 lb (59 kg)     Height 10/03/22 0943 5' (1.524 m)     Head Circumference --      Peak Flow --      Pain Score 10/03/22 0953 5     Pain Loc --      Pain Edu? --      Excl. in [place]? --     Most recent vital signs: Vitals:   10/03/22 0942  BP: 139/89  Pulse: 94  Resp: 18  Temp: 98.3 F (36.8 C)  SpO2: 96%     General: Awake, no distress.   CV:  Good peripheral perfusion. regular rate and  rhythm Resp:  Normal effort.  Abd:  No distention.   Other:  Right lower leg with a abscess noted to the anterior, old wound noted on the posterior, area is fluctuant on the anterior, neurovascular is intact   ED Results / Procedures / Treatments   Labs (all labs ordered are listed, but only abnormal results are displayed) Labs Reviewed  AEROBIC CULTURE W GRAM STAIN (SUPERFICIAL SPECIMEN)     EKG     RADIOLOGY     PROCEDURES:   .Marland KitchenIncision and Drainage  Date/Time: 10/03/2022 11:27 AM  Performed by: Versie Starks, PA-C Authorized by: Versie Starks,  PA-C   Consent:    Consent obtained:  Verbal   Consent given by:  Patient   Risks, benefits, and alternatives were discussed: yes     Risks discussed:  Bleeding, incomplete drainage, pain, infection and damage to other organs   Alternatives discussed:  No treatment Universal protocol:    Procedure explained and questions answered to patient or proxy's satisfaction: yes     Immediately prior to procedure, a time out was called: yes     Patient identity confirmed:  Verbally with patient Location:    Type:  Abscess   Location:  Lower extremity   Lower extremity location:  Leg   Leg location:  R lower leg Pre-procedure details:    Skin preparation:  Povidone-iodine Anesthesia:    Anesthesia method:  Topical application and local infiltration   Topical anesthetic:  LET   Local anesthetic:  Lidocaine 1% w/o epi Procedure type:    Complexity:  Simple Procedure details:    Incision types:  Stab incision  Drainage:  Purulent   Drainage amount:  Moderate   Wound treatment:  Wound left open Post-procedure details:    Procedure completion:  Tolerated well, no immediate complications    MEDICATIONS ORDERED IN ED: Medications  lidocaine-EPINEPHrine-tetracaine (LET) topical gel (has no administration in time range)  lidocaine (PF) (XYLOCAINE) 1 % injection 5 mL (has no administration in time range)     IMPRESSION / MDM / ASSESSMENT AND PLAN / ED COURSE  I reviewed the triage vital signs and the nursing notes.                              Differential diagnosis includes, but is not limited to, abscess, cellulitis, osteomyelitis  Patient's presentation is most consistent with acute complicated illness / injury requiring diagnostic workup.   We will obtain a wound culture during the I&D L ET applied to the right lower leg, will use Xylocaine if needed   See procedure note for incision and drainage.  Wound culture was obtained.  Patient is to follow-up with regular doctor in 2 days  for a wound check.  Return emergency department worsening.  Given a prescription for Keflex and discharged stable condition.   FINAL CLINICAL IMPRESSION(S) / ED DIAGNOSES   Final diagnoses:  Abscess of lower leg     Rx / DC Orders   ED Discharge Orders          Ordered    cephALEXin (KEFLEX) 500 MG capsule  4 times daily        10/03/22 1126    HYDROcodone-acetaminophen (NORCO/VICODIN) 5-325 MG tablet  Every 4 hours PRN        10/03/22 1126             Note:  This document was prepared using Dragon voice recognition software and may include unintentional dictation errors.    Versie Starks, PA-C 10/03/22 1128    Arta Silence, MD 10/03/22 [phone]

## 2022-10-03 NOTE — ED Triage Notes (Signed)
Pt here with right leg pain. Pt has 2 bites on her legs, with dark red outer circumference. Pt states bites are painful to touch and are warm to touch. Pt states some draining pus for the anterior bite.

## 2022-10-03 NOTE — Discharge Instructions (Signed)
Follow-up with regular doctor in 2 days for wound recheck.  Take your antibiotic as prescribed.  The wound culture results will return in approximately 24 to 48 hours.

## 2022-10-05 LAB — AEROBIC CULTURE W GRAM STAIN (SUPERFICIAL SPECIMEN): Gram Stain: NONE SEEN

## 2022-10-06 NOTE — Progress Notes (Cosign Needed Addendum)
ED Antimicrobial Stewardship Positive Culture Follow Up   [name] is an 56 y.o. female who presented to [facility] on 10/03/2022 with a chief complaint of  Chief Complaint  Patient presents with   Leg Pain   Insect Bite    Recent Results (from the past 720 hour(s))  Aerobic Culture w Gram Stain (superficial specimen)     Status: None   Collection Time: 10/03/22 10:57 AM   Specimen: Wound  Result Value Ref Range Status   Specimen Description   Final    WOUND RL Performed at [facility], [address], [place], [place] [address]    Special Requests   Final    NONE Performed at [facility], [address], [place], [place] [address]    Gram Stain   Final    NO WBC SEEN RARE GRAM POSITIVE COCCI IN CLUSTERS Performed at [facility], [place] [address] [place], [place] [address]    Culture   Final    ABUNDANT METHICILLIN RESISTANT STAPHYLOCOCCUS AUREUS   Report Status 10/05/2022 FINAL  Final   Organism ID, Bacteria METHICILLIN RESISTANT STAPHYLOCOCCUS AUREUS  Final      Susceptibility   Methicillin resistant staphylococcus aureus - MIC*    CIPROFLOXACIN >=8 RESISTANT Resistant     ERYTHROMYCIN >=8 RESISTANT Resistant     GENTAMICIN <=0.5 SENSITIVE Sensitive     OXACILLIN >=4 RESISTANT Resistant     TETRACYCLINE <=1 SENSITIVE Sensitive     VANCOMYCIN 1 SENSITIVE Sensitive     TRIMETH/SULFA <=10 SENSITIVE Sensitive     CLINDAMYCIN <=0.25 SENSITIVE Sensitive     RIFAMPIN <=0.5 SENSITIVE Sensitive     Inducible Clindamycin NEGATIVE Sensitive     * ABUNDANT METHICILLIN RESISTANT STAPHYLOCOCCUS AUREUS    [x]  Treated with cephalexin, organism not treated by antimicrobial  New antibiotic prescription: Bactrim DS 1 tablet po BID x 5 days  no refills  ED ProviderColin Rhein   NPI [id number]  10/06/22:  Called pt phone # in chart [phone] and spoke with pt's husband. Preferred pharmacy is [facility] [phone]. [place] and they are closed until Monday 12/4. Called pt's husband back (he is on the road driving) and left voicemail to return call to see about another pharmacy choice?   Martino Tompson A 10/06/2022, 3:54 PM Clinical Pharmacist

## 2022-10-08 NOTE — Progress Notes (Signed)
ED Antimicrobial Stewardship Positive Culture Follow Up   [name] is an 56 y.o. female who presented to [facility] on 10/03/2022 with a chief complaint of  Chief Complaint  Patient presents with   Leg Pain   Insect Bite    Recent Results (from the past 720 hour(s))  Aerobic Culture w Gram Stain (superficial specimen)     Status: None   Collection Time: 10/03/22 10:57 AM   Specimen: Wound  Result Value Ref Range Status   Specimen Description   Final    WOUND RL Performed at [facility], [address], [place], [place] [address]    Special Requests   Final    NONE Performed at [facility], [address] [place], [place] [address]    Gram Stain   Final    NO WBC SEEN RARE GRAM POSITIVE COCCI IN CLUSTERS Performed at [facility], [place] [address] [place], [place] [address]    Culture   Final    ABUNDANT METHICILLIN RESISTANT STAPHYLOCOCCUS AUREUS   Report Status 10/05/2022 FINAL  Final   Organism ID, Bacteria METHICILLIN RESISTANT STAPHYLOCOCCUS AUREUS  Final      Susceptibility   Methicillin resistant staphylococcus aureus - MIC*    CIPROFLOXACIN >=8 RESISTANT Resistant     ERYTHROMYCIN >=8 RESISTANT Resistant     GENTAMICIN <=0.5 SENSITIVE Sensitive     OXACILLIN >=4 RESISTANT Resistant     TETRACYCLINE <=1 SENSITIVE Sensitive     VANCOMYCIN 1 SENSITIVE Sensitive     TRIMETH/SULFA <=10 SENSITIVE Sensitive     CLINDAMYCIN <=0.25 SENSITIVE Sensitive     RIFAMPIN <=0.5 SENSITIVE Sensitive     Inducible Clindamycin NEGATIVE Sensitive     * ABUNDANT METHICILLIN RESISTANT STAPHYLOCOCCUS AUREUS    [x]  Treated with cephalexin, organism not treated by antimicrobial  New antibiotic prescription: Bactrim DS 1 tablet po BID x 5 days  no refills  ED ProviderColin Rhein   NPI [id number]  10/06/22:  Called pt phone # in chart [phone] and spoke with pt's husband. Preferred pharmacy is [facility] [phone]. [place] and they are closed until Monday 12/4. Called pt's husband back (he is on the road driving) and left voicemail to return call to see about another pharmacy choice?  10/08/2022 9:13 AM  Addendum 10/08/2022 - prescription as per above called into preferred pharmacy, [place], [facility], [place], [place] Work Cell: [phone] 10/08/2022 9:13 AM

## 2022-12-25 ENCOUNTER — Ambulatory Visit: Payer: Medicaid Other

## 2023-01-28 ENCOUNTER — Encounter: Payer: Self-pay | Admitting: Family

## 2023-01-28 ENCOUNTER — Ambulatory Visit: Payer: Medicaid Other | Admitting: Family

## 2023-01-28 DIAGNOSIS — Z113 Encounter for screening for infections with a predominantly sexual mode of transmission: Secondary | ICD-10-CM

## 2023-01-28 DIAGNOSIS — A599 Trichomoniasis, unspecified: Secondary | ICD-10-CM

## 2023-01-28 DIAGNOSIS — A6004 Herpesviral vulvovaginitis: Secondary | ICD-10-CM

## 2023-01-28 DIAGNOSIS — A6000 Herpesviral infection of urogenital system, unspecified: Secondary | ICD-10-CM | POA: Insufficient documentation

## 2023-01-28 LAB — HM HEPATITIS C SCREENING LAB: HM Hepatitis Screen: NEGATIVE

## 2023-01-28 LAB — HM HIV SCREENING LAB: HM HIV Screening: NEGATIVE

## 2023-01-28 LAB — WET PREP FOR TRICH, YEAST, CLUE
Trichomonas Exam: POSITIVE — AB
Yeast Exam: NEGATIVE

## 2023-01-28 MED ORDER — ACYCLOVIR 400 MG PO TABS: 400.0000 mg | ORAL_TABLET | Freq: Three times a day (TID) | ORAL | 0 refills | Status: DC

## 2023-01-28 MED ORDER — METRONIDAZOLE 500 MG PO TABS: 500.0000 mg | ORAL_TABLET | Freq: Two times a day (BID) | ORAL | 0 refills | Status: AC

## 2023-01-28 NOTE — Progress Notes (Signed)
[facility] Department  STI clinic/screening visit [place] [address] [phone]  Subjective:  [name] is a 57 y.o. female being seen today for an STI screening visit. The patient reports they {Actions; do/do not:19616} have symptoms.  Patient reports that they {Actions; do/do not:19616} desire a pregnancy in the next year.   They reported they {Actions; are/are not:16769} interested in discussing contraception today.    No LMP recorded. Patient has had a hysterectomy.  Patient has the following medical conditions:   Patient Active Problem List   Diagnosis Date Noted  . Exacerbation of Crohn's disease ([place]) 08/01/2018    Chief Complaint  Patient presents with  . SEXUALLY TRANSMITTED DISEASE    "Painful blisters"  x 1 1/2 months (vaginal)    HPI  Patient reports painful blister-like bumps in genital area, husband has same bumps. States he smokes crack and is having sex with his girlfriend, who is a "street ho". She has continued to have sex with her husband.  Does the patient using douching products? No  Last HIV test per patient/review of record was No results found for: "HMHIVSCREEN"  Lab Results  Component Value Date   HIV Non Reactive 08/01/2018   Patient reports last pap was No results found for: "DIAGPAP" No results found for: "SPECADGYN"  Screening for MPX risk: Does the patient have an unexplained rash? No Is the patient MSM? No Does the patient endorse multiple sex partners or anonymous sex partners? No Did the patient have close or sexual contact with a person diagnosed with MPX? No Has the patient traveled outside the [place] where MPX is endemic? No Is there a high clinical suspicion for MPX-- evidenced by one of the following No  -Unlikely to be chickenpox  -Lymphadenopathy  -Rash that present in same phase of evolution on any given body part See flowsheet for further details and programmatic requirements.    Immunization history:   There is no immunization history on file for this patient.   The following portions of the patient's history were reviewed and updated as appropriate: allergies, current medications, past medical history, past social history, past surgical history and problem list.  Objective:  There were no vitals filed for this visit.  Physical Exam   Assessment and Plan:  [name] is a 57 y.o. female presenting to the [facility] for STI screening  1. Screening for venereal disease ***   Patient accepted all screenings including ***oral, vaginal CT/GC and bloodwork for HIV/RPR, and wet prep. Patient meets criteria for HepB screening? {yes/no:20286}. Ordered? {Response; yes/no/na:63} Patient meets criteria for HepC screening? {yes/no:20286}. Ordered? {Response; yes/no/na:63}  Treat wet prep per standing order Discussed time line for State Lab results and that patient will be called with positive results and encouraged patient to call if she had not heard in 2 weeks.  Counseled to return or seek care for continued or worsening symptoms Recommended repeat testing in 3 months with positive results. Recommended condom use with all sex  Patient is currently using {CCO Contraception:21020264} to prevent pregnancy.    Return if symptoms worsen or fail to improve.  Future Appointments  Date Time Provider Page  01/28/2023  9:55 AM Jael Waldorf, Deliah Goody, FNP [facility] None    Marline Backbone, FNP

## 2023-01-28 NOTE — Progress Notes (Unsigned)
Pt is here for STD screening.  Wet mount results reviewed.  The patient was dispensed Metronidazole 500 mg #14 and Acyclovir 400 mg #30 today. I provided counseling today regarding the medication. We discussed the medication, the side effects and when to call clinic. Patient given the opportunity to ask questions. Questions answered.  Condoms declined.  Windle Guard, RN

## 2023-01-31 ENCOUNTER — Telehealth: Payer: Self-pay | Admitting: Family Medicine

## 2023-01-31 NOTE — Telephone Encounter (Signed)
Pt would like a prescription for Acyclovir.  I spoke with the Provider and informed the patient that a [facility] will [facility] to [facility].  Windle Guard, RN

## 2023-01-31 NOTE — Telephone Encounter (Signed)
Patient wants a nurse to call her about her Herpes. She has questions about that and the medication.

## 2023-02-06 ENCOUNTER — Telehealth: Payer: Self-pay | Admitting: Family Medicine

## 2023-02-06 NOTE — Telephone Encounter (Signed)
Please call me back I did not receive my prescription for my sti if you can please send it to the [facility] on [address] in [place]

## 2023-02-07 ENCOUNTER — Ambulatory Visit: Payer: Medicaid Other

## 2023-02-07 ENCOUNTER — Telehealth: Payer: Self-pay

## 2023-02-07 DIAGNOSIS — A539 Syphilis, unspecified: Secondary | ICD-10-CM | POA: Diagnosis not present

## 2023-02-07 MED ORDER — PENICILLIN G BENZATHINE 1200000 UNIT/2ML IM SUSY
2.4000 10*6.[IU] | PREFILLED_SYRINGE | INTRAMUSCULAR | Status: AC
  Administered 2023-02-07: 2.4 10*6.[IU] via INTRAMUSCULAR

## 2023-02-07 NOTE — Telephone Encounter (Signed)
LM for pt to return my call

## 2023-02-07 NOTE — Progress Notes (Signed)
In nurse clinic for syphilis treatment. Bicillin 2.9mu given IM x1 dose  -per orders from H. Middleton,FNP dated 02/07/23.Tolerated well. Client stayed 31mins ,no adverse reactions noted. Counseled client on return in 101mths repeat labs for titer.

## 2023-02-08 ENCOUNTER — Other Ambulatory Visit: Payer: Self-pay | Admitting: Family Medicine

## 2023-02-08 DIAGNOSIS — A6004 Herpesviral vulvovaginitis: Secondary | ICD-10-CM

## 2023-02-08 MED ORDER — VALACYCLOVIR HCL 1 G PO TABS: 1000.0000 mg | ORAL_TABLET | Freq: Every day | ORAL | 12 refills | Status: DC

## 2023-02-10 MED ORDER — ACYCLOVIR 400 MG PO TABS: 400.0000 mg | ORAL_TABLET | Freq: Two times a day (BID) | ORAL | 11 refills | Status: DC

## 2023-02-10 NOTE — Progress Notes (Signed)
Patient requesting suppressive therapy, virology negative, diagnosis based on clinical presentation. Rx for Acyclovir 400mg  PO BID to prevent outbreaks sent to her pharmacy.

## 2023-02-10 NOTE — Addendum Note (Signed)
Addended by: Colvin Caroli A on: 02/10/2023 11:55 PM   Modules accepted: Orders

## 2023-04-29 ENCOUNTER — Telehealth: Payer: Self-pay | Admitting: Family Medicine

## 2023-04-29 NOTE — Telephone Encounter (Signed)
Just tell her she can come and get it here tomorrow

## 2023-04-29 NOTE — Telephone Encounter (Signed)
Pt called cause she doesn't have enough money for her herpes medications but was needing it prescribed. She wanted to speak to a nurse about it.

## 2023-05-02 ENCOUNTER — Ambulatory Visit: Payer: 59 | Admitting: Family Medicine

## 2023-05-02 DIAGNOSIS — B009 Herpesviral infection, unspecified: Secondary | ICD-10-CM

## 2023-05-02 MED ORDER — ACYCLOVIR 400 MG PO TABS: 800.0000 mg | ORAL_TABLET | Freq: Every day | ORAL | 0 refills | Status: DC

## 2023-05-02 NOTE — Progress Notes (Signed)
Pt reports being about to run out of her prophylactic Acyclovir for her Herpes [place] and being unable to pay for it at the pharmacy until she gets her next check in 2 weeks. Provider consulted and agreed to provide patient enough Acyclovir to get her to her next paycheck. Provider did not see patient. RN visit. Medication given and education provided.

## 2023-07-07 DIAGNOSIS — Z419 Encounter for procedure for purposes other than remedying health state, unspecified: Secondary | ICD-10-CM | POA: Diagnosis not present

## 2023-07-09 DIAGNOSIS — F339 Major depressive disorder, recurrent, unspecified: Secondary | ICD-10-CM | POA: Diagnosis not present

## 2023-07-15 DIAGNOSIS — F411 Generalized anxiety disorder: Secondary | ICD-10-CM | POA: Diagnosis not present

## 2023-07-22 DIAGNOSIS — F339 Major depressive disorder, recurrent, unspecified: Secondary | ICD-10-CM | POA: Diagnosis not present

## 2023-07-29 DIAGNOSIS — F339 Major depressive disorder, recurrent, unspecified: Secondary | ICD-10-CM | POA: Diagnosis not present

## 2023-08-05 DIAGNOSIS — F339 Major depressive disorder, recurrent, unspecified: Secondary | ICD-10-CM | POA: Diagnosis not present

## 2023-08-06 DIAGNOSIS — Z419 Encounter for procedure for purposes other than remedying health state, unspecified: Secondary | ICD-10-CM | POA: Diagnosis not present

## 2023-08-12 DIAGNOSIS — F339 Major depressive disorder, recurrent, unspecified: Secondary | ICD-10-CM | POA: Diagnosis not present

## 2023-08-19 ENCOUNTER — Ambulatory Visit: Payer: 59 | Admitting: Family Medicine

## 2023-08-19 ENCOUNTER — Encounter: Payer: Self-pay | Admitting: Family Medicine

## 2023-08-19 ENCOUNTER — Other Ambulatory Visit: Payer: Self-pay

## 2023-08-19 DIAGNOSIS — B9689 Other specified bacterial agents as the cause of diseases classified elsewhere: Secondary | ICD-10-CM

## 2023-08-19 DIAGNOSIS — Z202 Contact with and (suspected) exposure to infections with a predominantly sexual mode of transmission: Secondary | ICD-10-CM

## 2023-08-19 DIAGNOSIS — Z113 Encounter for screening for infections with a predominantly sexual mode of transmission: Secondary | ICD-10-CM

## 2023-08-19 DIAGNOSIS — N760 Acute vaginitis: Secondary | ICD-10-CM

## 2023-08-19 DIAGNOSIS — B009 Herpesviral infection, unspecified: Secondary | ICD-10-CM

## 2023-08-19 DIAGNOSIS — F1490 Cocaine use, unspecified, uncomplicated: Secondary | ICD-10-CM | POA: Insufficient documentation

## 2023-08-19 DIAGNOSIS — F339 Major depressive disorder, recurrent, unspecified: Secondary | ICD-10-CM | POA: Diagnosis not present

## 2023-08-19 LAB — HM HEPATITIS C SCREENING LAB: HM Hepatitis Screen: NEGATIVE

## 2023-08-19 LAB — WET PREP FOR TRICH, YEAST, CLUE
Trichomonas Exam: NEGATIVE
Yeast Exam: NEGATIVE

## 2023-08-19 LAB — HEPATITIS B SURFACE ANTIGEN: Hepatitis B Surface Ag: NONREACTIVE

## 2023-08-19 LAB — HM HIV SCREENING LAB: HM HIV Screening: NEGATIVE

## 2023-08-19 MED ORDER — PENICILLIN G BENZATHINE 1200000 UNIT/2ML IM SUSY
2.4000 10*6.[IU] | PREFILLED_SYRINGE | INTRAMUSCULAR | Status: AC
  Administered 2023-08-19: 2.4 10*6.[IU] via INTRAMUSCULAR

## 2023-08-19 MED ORDER — ACYCLOVIR 400 MG PO TABS: 800.0000 mg | ORAL_TABLET | Freq: Every day | ORAL | 12 refills | Status: DC

## 2023-08-19 MED ORDER — ACYCLOVIR 400 MG PO TABS: 800.0000 mg | ORAL_TABLET | Freq: Two times a day (BID) | ORAL | Status: DC

## 2023-08-19 MED ORDER — METRONIDAZOLE 500 MG PO TABS: 500.0000 mg | ORAL_TABLET | Freq: Two times a day (BID) | ORAL | Status: AC

## 2023-08-19 NOTE — Progress Notes (Unsigned)
Pt is here for STD screening.  Wet mount results reviewed. The patient was dispensed Metronidazole 500 mg #14 and Acyclovir 400 mg #20 today. I provided counseling today regarding the medication. We discussed the medication, the side effects and when to call clinic. Patient given the opportunity to ask questions. Questions answered.   Bicillin 2.4 MU given IM.  Pt tolerated well.  Condoms given.  Berdie Ogren, RN

## 2023-08-19 NOTE — Progress Notes (Signed)
[facility] Department  STI clinic/screening visit [address] [place] [address] [phone]  Subjective:  [name] is a pleasant 57 y.o. female being seen today for an STI screening visit. The patient reports they do have symptoms.  Patient reports that they do not desire a pregnancy in the next year. They reported they are not interested in discussing contraception today. Patient has had hysterectomy. No PAP due.   No LMP recorded. Patient has had a hysterectomy.  Patient has the following medical conditions:   Patient Active Problem List   Diagnosis Date Noted   Herpes genitalia 01/28/2023   Exacerbation of Crohn's disease (HCC) 08/01/2018    Chief Complaint  Patient presents with   SEXUALLY TRANSMITTED DISEASE    Screening    Patient seen in this clinic about 4 months ago (end of March 2024) and diagnosed with Syphilis and trichomoniasis. Patient treated appropriately. Patient reports today that her partner was never tested or treated and they have been having unprotected sex. Patient requesting further testing and treatment today. Last sexual encounter without a condom yesterday. Reports one female partner (husband). Reports husband has had other partners.  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 She states she has never had them in this location, but has been having anal sex with her partner and thinks that is how they appeared in this area. Patient is requesting a prescription refill today as a sample because she cannot afford the meds from the pharmacy at this time.    Does the patient using douching products? No  Last HIV test per patient/review of record was  Lab Results  Component Value Date   HMHIVSCREEN Negative - Validated 01/28/2023    Lab Results  Component Value Date   HIV Non Reactive 08/01/2018     Last HEPC test per patient/review  of record was  Lab Results  Component Value Date   HMHEPCSCREEN Negative-Validated 01/28/2023     Last HEPB test per patient/review of record was March and was negative  Patient reports last pap was not applicable - hysterectomy.   Screening for MPX risk: Does the patient have an unexplained rash? No Is the patient MSM? No Does the patient endorse multiple sex partners or anonymous sex partners? No Did the patient have close or sexual contact with a person diagnosed with MPX? No Has the patient traveled outside the [place] where MPX is endemic? No Is there a high clinical suspicion for MPX-- evidenced by one of the following No  -Unlikely to be chickenpox  -Lymphadenopathy  -Rash that present in same phase of evolution on any given body part See flowsheet for further details and programmatic requirements.   Immunization history:   There is no immunization history on file for this patient.   The following portions of the patient's history were reviewed and updated as appropriate: allergies, current medications, past medical history, past social history, past surgical history and problem list.  Objective:  There were no vitals filed for this visit.  Physical Exam Vitals and nursing note reviewed. Exam conducted with a chaperone present Justice Rocher, RN present as chaperone for PE.).  Constitutional:      General: She is not in acute distress.    Appearance: Normal appearance.  HENT:     Head: Normocephalic.     Salivary Glands: Right salivary gland is not diffusely enlarged  or tender. Left salivary gland is not diffusely enlarged or tender.     Mouth/Throat:     Lips: Pink. No lesions.     Mouth: Mucous membranes are moist.     Tongue: No lesions. Tongue does not deviate from midline.     Pharynx: Oropharynx is clear. Uvula midline. No oropharyngeal exudate or posterior oropharyngeal erythema.     Tonsils: No tonsillar exudate.  Eyes:     General:        Right eye: No  discharge.        Left eye: No discharge.  Pulmonary:     Effort: Pulmonary effort is normal.  Abdominal:     General: A surgical scar is present.     Palpations: There is mass.     Comments: Patient reports crohn's disease requiring an ostomy that she had reversed. Since this time she reports a lump to the area where the ostomy was. She is currently trying to get back in to see her gastroenterologist for this. She asked me not to perform the abdominal exam due to the lump and specified it is tender.    Genitourinary:    Exam position: Lithotomy position.     Pubic Area: No rash or pubic lice.      Tanner stage (genital): 5.     Labia:        Right: No rash, tenderness or lesion.        Left: No rash, tenderness or lesion.      Vagina: Vaginal discharge present. No erythema, bleeding or lesions.     Rectum: Tenderness present.       Comments: pH = 8, increased, malodorous white vaginal discharge present.  Hysterectomy - no cervix present. Bimanual exam not performed per patient request. Multiple flat rectal lesions present. About 1/5cm in diameter each with a yellowish whitish center. Overall area surrounding lesions erythematous. No obvious discharge from lesions present.  Lymphadenopathy:     Head:     Right side of head: No submental, submandibular, tonsillar, preauricular or posterior auricular adenopathy.     Left side of head: No submental, submandibular, tonsillar, preauricular or posterior auricular adenopathy.     Cervical: No cervical adenopathy.     Right cervical: No superficial or posterior cervical adenopathy.    Left cervical: No superficial or posterior cervical adenopathy.     Upper Body:     Right upper body: No supraclavicular or axillary adenopathy.     Left upper body: No supraclavicular or axillary adenopathy.     Lower Body: Right inguinal adenopathy present. Left inguinal adenopathy present.  Skin:    General: Skin is warm and dry.     Findings: No rash.           Comments: Closed, healing skin tear present to right outer forearm.  Skin tone appropriate for ethnicity.   Neurological:     Mental Status: She is alert and oriented to person, place, and time.  Psychiatric:        Attention and Perception: Attention normal.        Mood and Affect: Mood and affect normal.        Speech: Speech normal.        Behavior: Behavior normal. Behavior is cooperative.        Thought Content: Thought content normal.     Assessment and Plan:  PRUE LINGENFELTER is a 57 y.o. female presenting to the [facility] Department for STI screening  1. Screening for venereal disease  - Chlamydia/Gonorrhea [facility] - HIV/HCV [place] [facility] - HBV Antigen/Antibody State Lab - WET PREP FOR TRICH, YEAST, CLUE  2. Syphilis contact Patient previously diagnosed with syphilis (end of March 2024). She was treated appropriately but has had sexual contact without protection since then with her husband who was never tested or treated. Highly likely patient has been re-exposed and should get retested and retreated today. Discussed the importance of abstaining from sex until her partner is tested and treated (if appropriate) for syphilis. She was encouraged to use condoms with all sexual encounters once she and her husband begin having intercourse again after he is tested and treated appropriately. The risk of using condoms during sex while being treated for syphilis and prior to her husband being treated were explained and again she was encouraged not to have sex until treatment complete for both her and her husband.  - Syphilis Serology, [place] [facility] - penicillin g benzathine (BICILLIN LA) 1200000 UNIT/2ML injection 2.4 Million Units  3. Trichomonas contact Patient reports when she was last seen in March (6 months ago), she was positive for Trich and was treated appropriately; however, her partner was never tested or treated and she has been re-exposed. Tested today  with wet prep and it was negative. No treatment necessary.  - WET PREP FOR TRICH, YEAST, CLUE  4. Herpes simplex infection Patient given Acyclovir 400mg , 20 tablets. This will last 5 days and is the CDC suggested dose for episodic recurrent treatment. This was given in the clinic today. Patient given med in office today because she states she cannot afford it at the pharmacy at this time.  - acyclovir (ZOVIRAX) 400 MG tablet; Take 2 tablets (800 mg total) by mouth 2 (two) times daily for 5 days. The following prescription was sent to the pharmacy. This is for suppressive therapy and will be available in 5 days, once treatment for current outbreak is complete.  - acyclovir (ZOVIRAX) 400 MG tablet; Take 2 tablets (800 mg total) by mouth daily.  Dispense: 30 tablet; Refill: 12  5. Bacterial vaginosis Amsel criteria met. pH>4.5, amine smell, clue cells present, abnormal vaginal discharge present.  - metroNIDAZOLE (FLAGYL) 500 MG tablet; Take 1 tablet (500 mg total) by mouth 2 (two) times daily for 7 days.  Patient accepted all screenings including oral, vaginal CT/GC and bloodwork for HIV/RPR, and wet prep. Patient meets criteria for HepB screening? Yes. Ordered? yes Patient meets criteria for HepC screening? Yes. Ordered? yes  Treat wet prep per standing order Discussed time line for State Lab results and that patient will be called with positive results and encouraged patient to call if she had not heard in 2 weeks.  Counseled to return or seek care for continued or worsening symptoms Recommended repeat testing in 3 months with positive results. Recommended condom use with all sex  Patient is currently using Sterilization for Men and Women to prevent pregnancy.    Return if symptoms worsen or fail to improve.  No future appointments.  Total time with patient 30 minutes.   Edmonia James, NP

## 2023-08-20 NOTE — Progress Notes (Signed)

## 2023-08-26 DIAGNOSIS — F339 Major depressive disorder, recurrent, unspecified: Secondary | ICD-10-CM | POA: Diagnosis not present

## 2023-09-04 DIAGNOSIS — F339 Major depressive disorder, recurrent, unspecified: Secondary | ICD-10-CM | POA: Diagnosis not present

## 2023-09-11 DIAGNOSIS — F339 Major depressive disorder, recurrent, unspecified: Secondary | ICD-10-CM | POA: Diagnosis not present

## 2023-09-18 DIAGNOSIS — F339 Major depressive disorder, recurrent, unspecified: Secondary | ICD-10-CM | POA: Diagnosis not present

## 2023-09-25 DIAGNOSIS — F332 Major depressive disorder, recurrent severe without psychotic features: Secondary | ICD-10-CM | POA: Diagnosis not present

## 2023-09-27 ENCOUNTER — Encounter: Payer: Self-pay | Admitting: Emergency Medicine

## 2023-09-27 ENCOUNTER — Emergency Department
Admission: EM | Admit: 2023-09-27 | Discharge: 2023-09-27 | Disposition: A | Discharge status: Home / Self Care | Payer: 59 | Attending: Emergency Medicine | Admitting: Emergency Medicine

## 2023-09-27 ENCOUNTER — Other Ambulatory Visit: Payer: Self-pay

## 2023-09-27 DIAGNOSIS — Z23 Encounter for immunization: Secondary | ICD-10-CM | POA: Insufficient documentation

## 2023-09-27 DIAGNOSIS — J45909 Unspecified asthma, uncomplicated: Secondary | ICD-10-CM | POA: Diagnosis not present

## 2023-09-27 DIAGNOSIS — L03116 Cellulitis of left lower limb: Secondary | ICD-10-CM | POA: Insufficient documentation

## 2023-09-27 DIAGNOSIS — J449 Chronic obstructive pulmonary disease, unspecified: Secondary | ICD-10-CM | POA: Insufficient documentation

## 2023-09-27 LAB — CBC WITH DIFFERENTIAL/PLATELET
Abs Immature Granulocytes: 0.03 10*3/uL (ref 0.00–0.07)
Basophils Absolute: 0 10*3/uL (ref 0.0–0.1)
Basophils Relative: 0 %
Eosinophils Absolute: 0.3 10*3/uL (ref 0.0–0.5)
Eosinophils Relative: 3 %
HCT: 42.4 % (ref 36.0–46.0)
Hemoglobin: 13.8 g/dL (ref 12.0–15.0)
Immature Granulocytes: 0 %
Lymphocytes Relative: 14 %
Lymphs Abs: 1.5 10*3/uL (ref 0.7–4.0)
MCH: 30 pg (ref 26.0–34.0)
MCHC: 32.5 g/dL (ref 30.0–36.0)
MCV: 92.2 fL (ref 80.0–100.0)
Monocytes Absolute: 0.8 10*3/uL (ref 0.1–1.0)
Monocytes Relative: 8 %
Neutro Abs: 8.1 10*3/uL — ABNORMAL HIGH (ref 1.7–7.7)
Neutrophils Relative %: 75 %
Platelets: 305 10*3/uL (ref 150–400)
RBC: 4.6 MIL/uL (ref 3.87–5.11)
RDW: 13.2 % (ref 11.5–15.5)
WBC: 10.7 10*3/uL — ABNORMAL HIGH (ref 4.0–10.5)
nRBC: 0 % (ref 0.0–0.2)

## 2023-09-27 LAB — BASIC METABOLIC PANEL
Anion gap: 8 (ref 5–15)
BUN: 12 mg/dL (ref 6–20)
CO2: 27 mmol/L (ref 22–32)
Calcium: 9 mg/dL (ref 8.9–10.3)
Chloride: 106 mmol/L (ref 98–111)
Creatinine, Ser: 0.61 mg/dL (ref 0.44–1.00)
GFR, Estimated: >60 mL/min (ref >60)
Glucose, Bld: 101 mg/dL — ABNORMAL HIGH (ref 70–99)
Potassium: 3.5 mmol/L (ref 3.5–5.1)
Sodium: 141 mmol/L (ref 135–145)

## 2023-09-27 MED ORDER — DOXYCYCLINE HYCLATE 100 MG PO CAPS: 100.0000 mg | ORAL_CAPSULE | Freq: Two times a day (BID) | ORAL | 0 refills | Status: DC

## 2023-09-27 MED ORDER — TETANUS-DIPHTH-ACELL PERTUSSIS 5-2.5-18.5 LF-MCG/0.5 IM SUSY
0.5000 mL | PREFILLED_SYRINGE | Freq: Once | INTRAMUSCULAR | Status: AC
  Administered 2023-09-27: 0.5 mL via INTRAMUSCULAR
  Filled 2023-09-27: qty 0.5

## 2023-09-27 NOTE — ED Triage Notes (Signed)
Pt here with an insect bite to her left leg since Sunday. Pt states she thinks she was bit by a fire ant but now it is swollen a lot more. Family states this has happened before and had a staph infx. Pt ambulatory to triage.

## 2023-09-27 NOTE — ED Provider Notes (Signed)
[facility] Provider Note    Event Date/Time   First MD Initiated Contact with Patient 09/27/23 [phone]     (approximate)   History   Insect Bite   HPI  [name] is a 57 y.o. female   presents to the ED with complaint of a questionable insect bite versus spider bite to her left leg with a small papule noted on Sunday.  Patient initially thought that this was a fire ant but now area has become more swollen and has been draining.  No known fever per patient.  Patient has history of COPD, asthma, Crohn's disease.  Also has history of staph infections per patient.  Patient also reports that has been over 10 years since she had her last tetanus immunization.      Physical Exam   Triage Vital Signs: ED Triage Vitals [09/27/23 0850]  Encounter Vitals Group     BP 134/81     Systolic BP Percentile      Diastolic BP Percentile      Pulse Rate 98     Resp 16     Temp 97.7 F (36.5 C)     Temp Source Oral     SpO2 97 %     Weight 130 lb 1.1 oz (59 kg)     Height 5' (1.524 m)     Head Circumference      Peak Flow      Pain Score 7     Pain Loc      Pain Education      Exclude from Growth Chart     Most recent vital signs: Vitals:   09/27/23 0850  BP: 134/81  Pulse: 98  Resp: 16  Temp: 97.7 F (36.5 C)  SpO2: 97%     General: Awake, no distress.  CV:  Good peripheral perfusion.  Resp:  Normal effort.  Abd:  No distention.  Other:  Left lower extremity mid calf area laterally there is a 1.5 cm draining lesion with erythema surrounding.  Skin is warm to palpation.  Pulses present distally.   ED Results / Procedures / Treatments   Labs (all labs ordered are listed, but only abnormal results are displayed) Labs Reviewed  CBC WITH DIFFERENTIAL/PLATELET - Abnormal; Notable for the following components:      Result Value   WBC 10.7 (*)    Neutro Abs 8.1 (*)    All other components within normal limits  BASIC METABOLIC PANEL - Abnormal;  Notable for the following components:   Glucose, Bld 101 (*)    All other components within normal limits     PROCEDURES:  Critical Care performed:   Procedures   MEDICATIONS ORDERED IN ED: Medications  Tdap (BOOSTRIX) injection 0.5 mL (0.5 mLs Intramuscular Given 09/27/23 0947)     IMPRESSION / MDM / ASSESSMENT AND PLAN / ED COURSE  I reviewed the triage vital signs and the nursing notes.   Differential diagnosis includes, but is not limited to, insect bite, spider bite, skin injury, skin infection, cellulitis.  57 year old female presents to the ED with an open wound to her left lower extremity without known visible insect bite.  Daughter states that there have been some spiders in the home but did not actually see 1 on her leg.  Clinically there is cellulitis with no drainable abscess.  Lab work is reassuring.  Patient is to start doxycycline 100 mg twice daily for 10 days and to follow-up with her  PCP.  She was given instructions to return to the emergency department if any severe worsening of her symptoms such as worsening of the site or development of fever or chills.      Patient's presentation is most consistent with acute complicated illness / injury requiring diagnostic workup.  FINAL CLINICAL IMPRESSION(S) / ED DIAGNOSES   Final diagnoses:  Cellulitis of left leg without foot     Rx / DC Orders   ED Discharge Orders          Ordered    doxycycline (VIBRAMYCIN) 100 MG capsule  2 times daily        09/27/23 1032             Note:  This document was prepared using Dragon voice recognition software and may include unintentional dictation errors.   Tommi Rumps, PA-C 09/27/23 1041    Sharman Cheek, MD 09/27/23 1531

## 2023-09-27 NOTE — Discharge Instructions (Addendum)
Follow-up with your primary care provider and if you do not have a primary care provider you can follow-up with urgent care.  A list of clinics is attached to your discharge instructions so that you may obtain a primary care provider.  Allow the area to get air frequently to help this heal.  Do not place any ointments to the area.  You may use warm moist compresses to it when sitting.  Begin taking antibiotics until completely finished.  Return to the emergency department if any severe worsening of your symptoms such as fever or spreading of the infection.  Please go to the following website to schedule new (and existing) patient appointments:   [email or web address]   The following is a list of primary care offices in the area who are accepting new patients at this time.  Please reach out to one of them directly and let them know you would like to schedule an appointment to follow up on an Emergency Department visit, and/or to establish a new primary care provider (PCP).  There are likely other primary care clinics in the are who are accepting new patients, but this is an excellent place to start:  [facility] Lead physician: Dr Shirlee Latch [address] [place], [place] [address] [phone]  [facility] Lead Physician: Dr Alba Cory [address], [place], [place] [address] [phone]  [facility]  Lead Physician: Dr Olevia Perches [address] [place], [place] [address] [phone]  [facility] Lead Physician: Dr Sofie Hartigan [address] [place], [place] [address] [phone]  [facility] at [facility] Lead Physician: Dr Bari Edward [address] [place], [place], [place] [address] [phone]

## 2023-10-02 DIAGNOSIS — F332 Major depressive disorder, recurrent severe without psychotic features: Secondary | ICD-10-CM | POA: Diagnosis not present

## 2023-10-04 DIAGNOSIS — F339 Major depressive disorder, recurrent, unspecified: Secondary | ICD-10-CM | POA: Diagnosis not present

## 2023-10-09 DIAGNOSIS — F339 Major depressive disorder, recurrent, unspecified: Secondary | ICD-10-CM | POA: Diagnosis not present

## 2023-10-16 DIAGNOSIS — F339 Major depressive disorder, recurrent, unspecified: Secondary | ICD-10-CM | POA: Diagnosis not present

## 2023-10-23 DIAGNOSIS — F339 Major depressive disorder, recurrent, unspecified: Secondary | ICD-10-CM | POA: Diagnosis not present

## 2023-10-24 DIAGNOSIS — F339 Major depressive disorder, recurrent, unspecified: Secondary | ICD-10-CM | POA: Diagnosis not present

## 2023-10-29 DIAGNOSIS — F339 Major depressive disorder, recurrent, unspecified: Secondary | ICD-10-CM | POA: Diagnosis not present

## 2023-10-31 DIAGNOSIS — F339 Major depressive disorder, recurrent, unspecified: Secondary | ICD-10-CM | POA: Diagnosis not present

## 2023-11-05 DIAGNOSIS — F339 Major depressive disorder, recurrent, unspecified: Secondary | ICD-10-CM | POA: Diagnosis not present

## 2023-11-07 DIAGNOSIS — F339 Major depressive disorder, recurrent, unspecified: Secondary | ICD-10-CM | POA: Diagnosis not present

## 2023-11-13 DIAGNOSIS — F339 Major depressive disorder, recurrent, unspecified: Secondary | ICD-10-CM | POA: Diagnosis not present

## 2023-11-14 DIAGNOSIS — F339 Major depressive disorder, recurrent, unspecified: Secondary | ICD-10-CM | POA: Diagnosis not present

## 2023-11-20 DIAGNOSIS — F339 Major depressive disorder, recurrent, unspecified: Secondary | ICD-10-CM | POA: Diagnosis not present

## 2023-11-21 DIAGNOSIS — F339 Major depressive disorder, recurrent, unspecified: Secondary | ICD-10-CM | POA: Diagnosis not present

## 2023-11-22 DIAGNOSIS — F339 Major depressive disorder, recurrent, unspecified: Secondary | ICD-10-CM | POA: Diagnosis not present

## 2023-11-25 DIAGNOSIS — F339 Major depressive disorder, recurrent, unspecified: Secondary | ICD-10-CM | POA: Diagnosis not present

## 2023-11-26 DIAGNOSIS — F339 Major depressive disorder, recurrent, unspecified: Secondary | ICD-10-CM | POA: Diagnosis not present

## 2023-11-27 DIAGNOSIS — F339 Major depressive disorder, recurrent, unspecified: Secondary | ICD-10-CM | POA: Diagnosis not present

## 2023-12-01 DIAGNOSIS — F339 Major depressive disorder, recurrent, unspecified: Secondary | ICD-10-CM | POA: Diagnosis not present

## 2023-12-03 DIAGNOSIS — F339 Major depressive disorder, recurrent, unspecified: Secondary | ICD-10-CM | POA: Diagnosis not present

## 2023-12-04 DIAGNOSIS — F339 Major depressive disorder, recurrent, unspecified: Secondary | ICD-10-CM | POA: Diagnosis not present

## 2023-12-10 ENCOUNTER — Ambulatory Visit: Payer: 59

## 2023-12-10 DIAGNOSIS — F339 Major depressive disorder, recurrent, unspecified: Secondary | ICD-10-CM | POA: Diagnosis not present

## 2023-12-11 DIAGNOSIS — F339 Major depressive disorder, recurrent, unspecified: Secondary | ICD-10-CM | POA: Diagnosis not present

## 2023-12-12 DIAGNOSIS — F339 Major depressive disorder, recurrent, unspecified: Secondary | ICD-10-CM | POA: Diagnosis not present

## 2023-12-13 DIAGNOSIS — F339 Major depressive disorder, recurrent, unspecified: Secondary | ICD-10-CM | POA: Diagnosis not present

## 2023-12-16 DIAGNOSIS — F339 Major depressive disorder, recurrent, unspecified: Secondary | ICD-10-CM | POA: Diagnosis not present

## 2023-12-17 DIAGNOSIS — F339 Major depressive disorder, recurrent, unspecified: Secondary | ICD-10-CM | POA: Diagnosis not present

## 2023-12-18 DIAGNOSIS — F339 Major depressive disorder, recurrent, unspecified: Secondary | ICD-10-CM | POA: Diagnosis not present

## 2023-12-22 DIAGNOSIS — F339 Major depressive disorder, recurrent, unspecified: Secondary | ICD-10-CM | POA: Diagnosis not present

## 2023-12-25 DIAGNOSIS — F339 Major depressive disorder, recurrent, unspecified: Secondary | ICD-10-CM | POA: Diagnosis not present

## 2023-12-28 DIAGNOSIS — F339 Major depressive disorder, recurrent, unspecified: Secondary | ICD-10-CM | POA: Diagnosis not present

## 2023-12-29 DIAGNOSIS — F339 Major depressive disorder, recurrent, unspecified: Secondary | ICD-10-CM | POA: Diagnosis not present

## 2023-12-31 DIAGNOSIS — F339 Major depressive disorder, recurrent, unspecified: Secondary | ICD-10-CM | POA: Diagnosis not present

## 2024-01-01 DIAGNOSIS — F339 Major depressive disorder, recurrent, unspecified: Secondary | ICD-10-CM | POA: Diagnosis not present

## 2024-01-05 DIAGNOSIS — F339 Major depressive disorder, recurrent, unspecified: Secondary | ICD-10-CM | POA: Diagnosis not present

## 2024-01-07 ENCOUNTER — Ambulatory Visit: Payer: 59

## 2024-01-07 DIAGNOSIS — F339 Major depressive disorder, recurrent, unspecified: Secondary | ICD-10-CM | POA: Diagnosis not present

## 2024-01-09 ENCOUNTER — Ambulatory Visit

## 2024-01-12 DIAGNOSIS — F339 Major depressive disorder, recurrent, unspecified: Secondary | ICD-10-CM | POA: Diagnosis not present

## 2024-01-13 DIAGNOSIS — F339 Major depressive disorder, recurrent, unspecified: Secondary | ICD-10-CM | POA: Diagnosis not present

## 2024-01-14 DIAGNOSIS — F339 Major depressive disorder, recurrent, unspecified: Secondary | ICD-10-CM | POA: Diagnosis not present

## 2024-01-15 DIAGNOSIS — F339 Major depressive disorder, recurrent, unspecified: Secondary | ICD-10-CM | POA: Diagnosis not present

## 2024-01-19 DIAGNOSIS — F339 Major depressive disorder, recurrent, unspecified: Secondary | ICD-10-CM | POA: Diagnosis not present

## 2024-01-23 DIAGNOSIS — F339 Major depressive disorder, recurrent, unspecified: Secondary | ICD-10-CM | POA: Diagnosis not present

## 2024-01-26 DIAGNOSIS — F339 Major depressive disorder, recurrent, unspecified: Secondary | ICD-10-CM | POA: Diagnosis not present

## 2024-01-30 DIAGNOSIS — F339 Major depressive disorder, recurrent, unspecified: Secondary | ICD-10-CM | POA: Diagnosis not present

## 2024-02-04 DIAGNOSIS — F339 Major depressive disorder, recurrent, unspecified: Secondary | ICD-10-CM | POA: Diagnosis not present

## 2024-02-07 ENCOUNTER — Encounter: Payer: Self-pay | Admitting: Nurse Practitioner

## 2024-02-07 ENCOUNTER — Ambulatory Visit: Admitting: Nurse Practitioner

## 2024-02-07 DIAGNOSIS — Z202 Contact with and (suspected) exposure to infections with a predominantly sexual mode of transmission: Secondary | ICD-10-CM

## 2024-02-07 DIAGNOSIS — Z113 Encounter for screening for infections with a predominantly sexual mode of transmission: Secondary | ICD-10-CM

## 2024-02-07 LAB — HEPATITIS B SURFACE ANTIGEN

## 2024-02-07 LAB — WET PREP FOR TRICH, YEAST, CLUE
Trichomonas Exam: NEGATIVE
Yeast Exam: NEGATIVE

## 2024-02-07 LAB — HM HEPATITIS C SCREENING LAB: HM Hepatitis Screen: NEGATIVE

## 2024-02-07 LAB — HM HIV SCREENING LAB: HM HIV Screening: NEGATIVE

## 2024-02-07 MED ORDER — PENICILLIN G BENZATHINE 1200000 UNIT/2ML IM SUSY
2.4000 10*6.[IU] | PREFILLED_SYRINGE | INTRAMUSCULAR | Status: AC
  Administered 2024-02-07: 2.4 10*6.[IU] via INTRAMUSCULAR

## 2024-02-07 NOTE — Progress Notes (Signed)
 Pt is here for STD visit and a contact to syphilis. Pt received bicillin injection today. 1.2MU given at the [facility] by K. Alivia Cimino, RN, and 1.2MU given at the [facility] by B. Theile, Charity fundraiser. Patient tolerated well to injections with no signs of adverse reactions.Patient declined condoms and given the opportunity to ask questions for clarifications. Sonda Primes, RN.

## 2024-02-07 NOTE — Progress Notes (Signed)
 [facility] Department STI clinic [address] [place] [address] Main phone: [phone]  STI screening visit  Subjective:  [name] is a 58 y.o. female being seen today for an STI screening visit. The patient reports they do have symptoms.  Patient reports that they do not desire a pregnancy in the next year.   They reported they are not interested in discussing contraception today.    No LMP recorded. Patient has had a hysterectomy.  Patient has the following medical conditions:  Patient Active Problem List   Diagnosis Date Noted   Crack cocaine use 08/19/2023   Herpes genitalia 01/28/2023   Exacerbation of Crohn's disease (HCC) 08/01/2018    Chief Complaint  Patient presents with   SEXUALLY TRANSMITTED DISEASE   Patient is a pleasant 58 y.o. female who presents to the office today requesting treatment for syphilis as a contact and symptomatic STI testing. Patient reports symptoms include bumps to the genital area, but she is unsure if they are an HSV outbreak or a possible syphilis chancre. Patient indicates 1 female partner in the last 2 months. She reports practicing vaginal and anal sex and does not use condoms. Patient indicates a history of HSV, trich, syphilis. Her syphilis history is extensive and includes: Positive RPR with 1:1024 titer in 01/2023 followed by 1 bicillin injection 02/07/23 with symptoms.  Also, positive RPR with titer of 1:256 08/2023 (reflects successful bicillin treatment as titer decreased by 4x's). Today she reports as a contact to husband who is positve. She states he is in jail as of 5 days ago and will be for 11 months.  Patient reports last sex was 6 days ago. She indicates hysterectomy and does not have periods.    Last HIV test per patient/review of record was  Lab Results  Component Value Date   HMHIVSCREEN Negative - Validated 08/19/2023    Lab Results  Component Value Date   HIV Non Reactive  08/01/2018    Last HEPC test per patient/review of record was  Lab Results  Component Value Date   HMHEPCSCREEN Negative-Validated 08/19/2023   Last HEPB test per patient/review of record was No components found for: "HMHEPBSCREEN"   Patient reports last pap was:   No results found for: "DIAGPAP", "HPVHIGH", "ADEQPAP" No results found for: "SPECADGYN" No Cervical Cancer Screening results to display.  Screening for MPX risk: Does the patient have an unexplained rash? No Is the patient MSM? No Does the patient endorse multiple sex partners or anonymous sex partners? No Did the patient have close or sexual contact with a person diagnosed with MPX? No Has the patient traveled outside the [place] where MPX is endemic? No Is there a high clinical suspicion for MPX-- evidenced by one of the following No  -Unlikely to be chickenpox  -Lymphadenopathy  -Rash that present in same phase of evolution on any given body part See flowsheet for further details and programmatic requirements.   Immunization history:  Immunization History  Administered Date(s) Administered   Tdap 09/27/2023     The following portions of the patient's history were reviewed and updated as appropriate: allergies, current medications, past medical history, past social history, past surgical history and problem list.  Objective:  There were no vitals filed for this visit.  Physical Exam Nursing note reviewed. Exam conducted with a chaperone present.  Constitutional:      Appearance: Normal appearance.  HENT:     Head: Normocephalic.  Salivary Glands: Right salivary gland is not diffusely enlarged or tender. Left salivary gland is not diffusely enlarged or tender.     Mouth/Throat:     Lips: Pink. No lesions.     Mouth: Mucous membranes are moist.     Tongue: No lesions. Tongue does not deviate from midline.     Pharynx: Oropharynx is clear. Uvula midline.     Tonsils: No tonsillar exudate.  Eyes:     General:         Right eye: No discharge.        Left eye: No discharge.  Pulmonary:     Effort: Pulmonary effort is normal.  Genitourinary:    Exam position: Lithotomy position.     Pubic Area: No rash or pubic lice.      Tanner stage (genital): 5.     Labia:        Right: No rash, tenderness, lesion or injury.        Left: No rash, tenderness, lesion or injury.      Vagina: Normal. No vaginal discharge, erythema, tenderness, bleeding or lesions.     Cervix: Normal. No cervical motion tenderness, discharge, friability, lesion, erythema, cervical bleeding or eversion.     Uterus: Normal.      Adnexa: Right adnexa normal and left adnexa normal.       Comments: pH<4.5 Diagram indicates tiny dark blue/purple spots consistent with varicose blood blisters to the labia. Areas are not open and no bleeding or drainage is noted. Patient with no reported pain.  Lymphadenopathy:     Head:     Right side of head: No submental, submandibular, tonsillar, preauricular or posterior auricular adenopathy.     Left side of head: No submental, submandibular, tonsillar, preauricular or posterior auricular adenopathy.     Cervical: No cervical adenopathy.     Right cervical: No superficial or posterior cervical adenopathy.    Left cervical: No superficial or posterior cervical adenopathy.     Upper Body:     Right upper body: No supraclavicular or axillary adenopathy.     Left upper body: No supraclavicular or axillary adenopathy.     Lower Body: No right inguinal adenopathy. No left inguinal adenopathy.  Skin:    General: Skin is warm and dry.     Findings: No rash.     Comments: Skin tone appropriate for ethnicity.   Neurological:     Mental Status: She is alert and oriented to person, place, and time.  Psychiatric:        Attention and Perception: Attention normal.        Mood and Affect: Mood and affect normal.        Speech: Speech normal.        Behavior: Behavior normal. Behavior is cooperative.         Thought Content: Thought content normal.     Assessment and Plan:  [name] is a 58 y.o. female presenting to the [facility] Department for STI screening  1. Syphilis contact (Primary) Discussed this situation with Stasia Cavalier (Communicable Disease Nursing Supervisor).  Treating patient with bicillin injection today as a contact to syphilis.  Patient has no symptoms/signs on PE. What she reported as bumps to the genital area are consistent with blood blisters, not syphilitic chancre. No evidence of chancre or rash. Also obtaining serum labs for syphilis. If lab comes back positive with titer above 1:256 patient will need series of three injections, as this  indicates new infection and with no signs we cannot be sure of infection phase. If titer elevated, but not above 1:256, patient should be retested in one month to monitor levels and assess for indication of additional bicillin.   - penicillin g benzathine (BICILLIN LA) 1200000 UNIT/2ML injection 2.4 Million Units  2. Screening for venereal disease  - Chlamydia/Gonorrhea [place] [facility] - HBV Antigen/Antibody [facility] Lab - HIV/HCV [place] [facility] - Syphilis Serology,  Lab - WET PREP FOR TRICH, YEAST, CLUE  Patient accepted all screenings including oral, vaginal CT/GC and bloodwork for HIV/RPR, and wet prep. Patient meets criteria for HepB screening? Yes. Ordered? yes Patient meets criteria for HepC screening? Yes. Ordered? yes  Treat wet prep per standing order Discussed time line for State Lab results and that patient will be called with positive results and encouraged patient to call if she had not heard in 2 weeks.  Counseled to return or seek care for continued or worsening symptoms Recommended repeat testing in 3 months with positive results. Recommended condom use with all sex for STI prevention.   Patient is currently using Sterilization for Men and Women to prevent pregnancy.    Return if symptoms  worsen or fail to improve.  No future appointments.  Total time with patient 30 minutes.   Edmonia James, NP

## 2024-02-15 DIAGNOSIS — B009 Herpesviral infection, unspecified: Secondary | ICD-10-CM | POA: Diagnosis not present

## 2024-02-15 DIAGNOSIS — M1990 Unspecified osteoarthritis, unspecified site: Secondary | ICD-10-CM | POA: Diagnosis not present

## 2024-02-17 ENCOUNTER — Telehealth: Payer: Self-pay

## 2024-02-17 LAB — SYPHILIS SEROLOGY, ~~LOC~~ LAB
RPR, Quant: 1:16 {titer}
RPR: REACTIVE — AB

## 2024-02-17 NOTE — Telephone Encounter (Addendum)
 Call pt re syphilis results from 02/07/24 specimen. RPR= Reactive Titer= 1:16  Received Bicillin LA 2.4 MU on 02/07/24.  RTC for f/u in

## 2024-02-17 NOTE — Telephone Encounter (Signed)
 Reviewed syphilis results of RPR reactive, 1:16 titer.   Syphilis history: 01/28/2023 RPR reactive, 1:1024 titer 02/07/2023 treated with 2.4 million units bicillin x1 08/19/2023 treated at time of visit as syphilis contact with 2.4 million units bicillin x1; RPR drawn that day showed 1:256 titer 02/07/2024 again treated at time of visit as syphilis contact with 2.4 million units bicillin x1; RPR drawn shows 1:16 titer  Plan:  Given fourfold decrease in titer and appropriate treatment as syphilis contact 02/07/24, no further treatment is needed.   Tempie Fee, MD 02/17/24  5:32 PM

## 2024-02-18 NOTE — Telephone Encounter (Signed)
 Phone call to [phone]. Received message that calling restrictions prevented completion of call. Called from ring central and from [facility] cell phone; received same message.  Message sent through MyChart.

## 2024-02-27 NOTE — Telephone Encounter (Signed)
 Sent My-Chart message

## 2024-02-27 NOTE — Telephone Encounter (Signed)
 Phone call to [phone]. Received message that calling restrictions prevented completion of call. Called from ring central and from [facility] cell phone; received same message.

## 2024-03-05 DIAGNOSIS — F411 Generalized anxiety disorder: Secondary | ICD-10-CM | POA: Diagnosis not present

## 2024-03-08 DIAGNOSIS — F411 Generalized anxiety disorder: Secondary | ICD-10-CM | POA: Diagnosis not present

## 2024-03-15 DIAGNOSIS — F411 Generalized anxiety disorder: Secondary | ICD-10-CM | POA: Diagnosis not present

## 2024-03-22 DIAGNOSIS — F411 Generalized anxiety disorder: Secondary | ICD-10-CM | POA: Diagnosis not present

## 2024-03-29 DIAGNOSIS — F411 Generalized anxiety disorder: Secondary | ICD-10-CM | POA: Diagnosis not present

## 2024-04-05 DIAGNOSIS — F411 Generalized anxiety disorder: Secondary | ICD-10-CM | POA: Diagnosis not present

## 2024-04-19 DIAGNOSIS — F411 Generalized anxiety disorder: Secondary | ICD-10-CM | POA: Diagnosis not present

## 2024-04-26 DIAGNOSIS — F411 Generalized anxiety disorder: Secondary | ICD-10-CM | POA: Diagnosis not present

## 2024-05-03 DIAGNOSIS — F411 Generalized anxiety disorder: Secondary | ICD-10-CM | POA: Diagnosis not present

## 2024-05-10 DIAGNOSIS — F411 Generalized anxiety disorder: Secondary | ICD-10-CM | POA: Diagnosis not present

## 2024-05-12 DIAGNOSIS — H5213 Myopia, bilateral: Secondary | ICD-10-CM | POA: Diagnosis not present

## 2024-05-17 DIAGNOSIS — F411 Generalized anxiety disorder: Secondary | ICD-10-CM | POA: Diagnosis not present

## 2024-05-18 ENCOUNTER — Ambulatory Visit: Admitting: Physician Assistant

## 2024-05-24 DIAGNOSIS — F411 Generalized anxiety disorder: Secondary | ICD-10-CM | POA: Diagnosis not present

## 2024-05-31 DIAGNOSIS — F411 Generalized anxiety disorder: Secondary | ICD-10-CM | POA: Diagnosis not present

## 2024-06-04 ENCOUNTER — Other Ambulatory Visit: Payer: Self-pay | Admitting: Nurse Practitioner

## 2024-06-04 ENCOUNTER — Telehealth: Payer: Self-pay | Admitting: Family Medicine

## 2024-06-04 DIAGNOSIS — B009 Herpesviral infection, unspecified: Secondary | ICD-10-CM

## 2024-06-04 DIAGNOSIS — A6004 Herpesviral vulvovaginitis: Secondary | ICD-10-CM

## 2024-06-04 NOTE — Telephone Encounter (Signed)
 Attempted to call patient x2 regarding refill request. Number no longer valid.   The system indicates she wants to refill her acyclovir  prescription; it is written for 800 mg once daily, #180 tabs (90 days) dispensed. Last ordered 08/19/2023 by NP Clarita Narrow. I am calling because this is the incorrect dose for suppression therapy (as seems to be indicated by # tablets ordered).   If she would like to stay on acyclovir  for herpes suppression, it should be 400 mg BID.   If she would like to switch to valacyclovir  for once daily dosing, that would be 500 mg once daily.   Dorothyann Helling, MD 06/04/24  1:41 PM

## 2024-06-07 DIAGNOSIS — F411 Generalized anxiety disorder: Secondary | ICD-10-CM | POA: Diagnosis not present

## 2024-06-11 ENCOUNTER — Telehealth: Payer: Self-pay | Admitting: Family Medicine

## 2024-06-11 NOTE — Telephone Encounter (Signed)
 Needs rx refill. Updated number (dr macario tried calling previously but number was incorrect)

## 2024-06-14 DIAGNOSIS — F411 Generalized anxiety disorder: Secondary | ICD-10-CM | POA: Diagnosis not present

## 2024-06-17 ENCOUNTER — Other Ambulatory Visit: Payer: Self-pay | Admitting: Nurse Practitioner

## 2024-06-17 ENCOUNTER — Ambulatory Visit

## 2024-06-17 DIAGNOSIS — B009 Herpesviral infection, unspecified: Secondary | ICD-10-CM

## 2024-06-21 DIAGNOSIS — F411 Generalized anxiety disorder: Secondary | ICD-10-CM | POA: Diagnosis not present

## 2024-06-28 DIAGNOSIS — F411 Generalized anxiety disorder: Secondary | ICD-10-CM | POA: Diagnosis not present

## 2024-07-05 DIAGNOSIS — F411 Generalized anxiety disorder: Secondary | ICD-10-CM | POA: Diagnosis not present

## 2024-07-12 DIAGNOSIS — F411 Generalized anxiety disorder: Secondary | ICD-10-CM | POA: Diagnosis not present

## 2024-07-14 DIAGNOSIS — F411 Generalized anxiety disorder: Secondary | ICD-10-CM | POA: Diagnosis not present

## 2024-07-15 ENCOUNTER — Ambulatory Visit

## 2024-07-19 DIAGNOSIS — F411 Generalized anxiety disorder: Secondary | ICD-10-CM | POA: Diagnosis not present

## 2024-07-23 DIAGNOSIS — F411 Generalized anxiety disorder: Secondary | ICD-10-CM | POA: Diagnosis not present

## 2024-07-26 DIAGNOSIS — F411 Generalized anxiety disorder: Secondary | ICD-10-CM | POA: Diagnosis not present

## 2024-07-30 DIAGNOSIS — F411 Generalized anxiety disorder: Secondary | ICD-10-CM | POA: Diagnosis not present

## 2024-08-02 DIAGNOSIS — F411 Generalized anxiety disorder: Secondary | ICD-10-CM | POA: Diagnosis not present

## 2024-08-06 DIAGNOSIS — F411 Generalized anxiety disorder: Secondary | ICD-10-CM | POA: Diagnosis not present

## 2024-08-09 DIAGNOSIS — F411 Generalized anxiety disorder: Secondary | ICD-10-CM | POA: Diagnosis not present

## 2024-08-13 ENCOUNTER — Ambulatory Visit

## 2024-08-13 DIAGNOSIS — F411 Generalized anxiety disorder: Secondary | ICD-10-CM | POA: Diagnosis not present

## 2024-08-16 DIAGNOSIS — F411 Generalized anxiety disorder: Secondary | ICD-10-CM | POA: Diagnosis not present

## 2024-08-23 DIAGNOSIS — F411 Generalized anxiety disorder: Secondary | ICD-10-CM | POA: Diagnosis not present

## 2024-08-30 DIAGNOSIS — F411 Generalized anxiety disorder: Secondary | ICD-10-CM | POA: Diagnosis not present

## 2024-09-06 DIAGNOSIS — F411 Generalized anxiety disorder: Secondary | ICD-10-CM | POA: Diagnosis not present

## 2024-09-13 DIAGNOSIS — F411 Generalized anxiety disorder: Secondary | ICD-10-CM | POA: Diagnosis not present

## 2024-09-17 ENCOUNTER — Ambulatory Visit (INDEPENDENT_AMBULATORY_CARE_PROVIDER_SITE_OTHER)

## 2024-09-17 VITALS — BP 117/82 | HR 77 | Temp 97.8°F | Ht 60.0 in | Wt 139.4 lb

## 2024-09-17 DIAGNOSIS — Z1231 Encounter for screening mammogram for malignant neoplasm of breast: Secondary | ICD-10-CM

## 2024-09-17 DIAGNOSIS — M19041 Primary osteoarthritis, right hand: Secondary | ICD-10-CM | POA: Insufficient documentation

## 2024-09-17 DIAGNOSIS — K5080 Crohn's disease of both small and large intestine without complications: Secondary | ICD-10-CM

## 2024-09-17 DIAGNOSIS — Z8619 Personal history of other infectious and parasitic diseases: Secondary | ICD-10-CM | POA: Diagnosis not present

## 2024-09-17 DIAGNOSIS — B009 Herpesviral infection, unspecified: Secondary | ICD-10-CM | POA: Insufficient documentation

## 2024-09-17 DIAGNOSIS — Z136 Encounter for screening for cardiovascular disorders: Secondary | ICD-10-CM | POA: Diagnosis not present

## 2024-09-17 DIAGNOSIS — J449 Chronic obstructive pulmonary disease, unspecified: Secondary | ICD-10-CM | POA: Diagnosis not present

## 2024-09-17 DIAGNOSIS — M19042 Primary osteoarthritis, left hand: Secondary | ICD-10-CM

## 2024-09-17 DIAGNOSIS — Z131 Encounter for screening for diabetes mellitus: Secondary | ICD-10-CM

## 2024-09-17 DIAGNOSIS — M722 Plantar fascial fibromatosis: Secondary | ICD-10-CM | POA: Diagnosis not present

## 2024-09-17 DIAGNOSIS — Z113 Encounter for screening for infections with a predominantly sexual mode of transmission: Secondary | ICD-10-CM

## 2024-09-17 DIAGNOSIS — Z23 Encounter for immunization: Secondary | ICD-10-CM

## 2024-09-17 MED ORDER — ALBUTEROL SULFATE HFA 108 (90 BASE) MCG/ACT IN AERS: 2.0000 | INHALATION_SPRAY | RESPIRATORY_TRACT | 3 refills | Status: AC | PRN

## 2024-09-17 MED ORDER — ACYCLOVIR 400 MG PO TABS: 400.0000 mg | ORAL_TABLET | Freq: Two times a day (BID) | ORAL | 3 refills | Status: AC

## 2024-09-17 MED ORDER — DICLOFENAC SODIUM 1 % EX GEL: 2.0000 g | Freq: Four times a day (QID) | CUTANEOUS | 3 refills | Status: AC

## 2024-09-17 MED ORDER — SPIRIVA RESPIMAT 2.5 MCG/ACT IN AERS: 2.0000 | INHALATION_SPRAY | Freq: Every day | RESPIRATORY_TRACT | 3 refills | Status: AC

## 2024-09-17 NOTE — Patient Instructions (Addendum)
 For pain related to arthritis, I recommend Voltaren gel or Tylenol  arthritis. I ordered hand X-rays for you to get.  For plantar fasciitis, I recommend heel pads.   [facility]: Phone: [phone]

## 2024-09-17 NOTE — Progress Notes (Signed)
 New patient visit   Patient: [name]   DOB: 21-Nov-1965   58 y.o. Female  MRN: [id number] Visit Date: 09/17/2024  Today's healthcare provider: Isaiah DELENA Pepper, MD   Chief Complaint  Patient presents with   New Patient (Initial Visit)    Patient is here today to establish care with a primary provider.    Vaccines- Hepatitis B, Pneumococcal, Shingles, Flu     Subjective    [name] is a 58 y.o. female who presents today as a new patient to establish care.   Discussed the use of AI scribe software for clinical note transcription with the patient, who gave verbal consent to proceed.  History of Present Illness [name] is a 59 year old female with Crohn's disease who presents with hand and foot pain. She is accompanied by her daughter, Brittany.  She has experienced problems with her fingers for a couple of years, describing them as 'crooked' and painful, with limited ability to bend. 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 [employer] eraser.  She also experiences pain in her L foot, particularly in the heel and ankle, which feels swollen and throbs, causing her to limp at work. She mentions a callus on the bottom of her foot and has a history of plantar fasciitis, which was previously managed with rest, ice, and proper footwear. She recently purchased new shoes, but they have not alleviated her symptoms.  She has a lump on her stomach, which she describes as gurgling and feeling like part of her intestine. This has been present since shortly after her surgery for Crohn's disease in 2020.  She has a history of Crohn's disease, with her last GI specialist visit in 2021. Since her surgery, she reports no flare-ups, though certain foods and stress can cause bloating.  She has a history of COPD and experiences difficulty breathing, especially in the  summer. She smokes less than before, about half a pack every two to three days, and vapes more frequently. She previously used inhalers, including albuterol  and Flovent, but has not had them recently.  She has a history of syphilis, for which she received multiple penicillin  shots. She has not been sexually active since her husband's incarceration and wishes to confirm that she was fully treated.    Past Medical History:  Diagnosis Date   Asthma    COPD (chronic obstructive pulmonary disease) (HCC)    Crohn's disease (HCC)    Depression    History of colon polyps    Past Surgical History:  Procedure Laterality Date   ABDOMINAL HYSTERECTOMY     CHOLECYSTECTOMY     COLONOSCOPY WITH PROPOFOL  N/A 11/18/2018   Procedure: COLONOSCOPY WITH PROPOFOL ;  Surgeon: Toledo, Ladell POUR, MD;  Location: [facility];  Service: Gastroenterology;  Laterality: N/A;   ESOPHAGOGASTRODUODENOSCOPY (EGD) WITH PROPOFOL  N/A 11/18/2018   Procedure: ESOPHAGOGASTRODUODENOSCOPY (EGD) WITH PROPOFOL ;  Surgeon: Toledo, Ladell POUR, MD;  Location: [facility];  Service: Gastroenterology;  Laterality: N/A;   EYE SURGERY     No family status information on file.   No family history on file. Social History   Socioeconomic History   Marital status: Married    Spouse name: Not on file   Number of children: Not on file   Years of education: Not on file   Highest education level: Not on file  Occupational History   Not on file  Tobacco Use   Smoking status: Every Day    Current packs/day: 1.00    Types: Cigarettes   Smokeless tobacco: Never  Vaping Use   Vaping status: Every Day  Substance and Sexual Activity   Alcohol use: No   Drug use: Yes    Types: Crack cocaine    Comment: Every once in a while. Last used 08/18/23   Sexual activity: Yes    Partners: Male    Birth control/protection: None  Other Topics Concern   Not on file  Social History Narrative   Not on file   Social Drivers of Health    Financial Resource Strain: Medium Risk (09/17/2024)   Overall Financial Resource Strain (CARDIA)    Difficulty of Paying Living Expenses: Somewhat hard  Food Insecurity: Food Insecurity Present (09/17/2024)   Hunger Vital Sign    Worried About Running Out of Food in the Last Year: Often true    Ran Out of Food in the Last Year: Often true  Transportation Needs: Unmet Transportation Needs (09/17/2024)   PRAPARE - Administrator, Civil Service (Medical): Yes    Lack of Transportation (Non-Medical): No  Physical Activity: Sufficiently Active (09/17/2024)   Exercise Vital Sign    Days of Exercise per Week: 7 days    Minutes of Exercise per Session: 150+ min  Stress: Stress Concern Present (09/17/2024)   [employer] of Occupational Health - Occupational Stress Questionnaire    Feeling of Stress: To some extent  Social Connections: Socially Isolated (09/17/2024)   Social Connection and Isolation Panel    Frequency of Communication with Friends and Family: Twice a week    Frequency of Social Gatherings with Friends and Family: Never    Attends Religious Services: Never    Database Administrator or Organizations: No    Attends Banker Meetings: Never    Marital Status: Married   Outpatient Medications Prior to Visit  Medication Sig   [DISCONTINUED] acyclovir  (ZOVIRAX ) 400 MG tablet Take 1 tablet (400 mg total) by mouth 2 (two) times daily.   [DISCONTINUED] albuterol  (PROVENTIL  HFA;VENTOLIN  HFA) 108 (90 Base) MCG/ACT inhaler Inhale 2 puffs into the lungs every 4 (four) hours as needed for wheezing or shortness of breath.   [DISCONTINUED] Fluticasone Propionate, Inhal, (FLOVENT IN) Inhale 2 puffs into the lungs daily.   [DISCONTINUED] dicyclomine  (BENTYL ) 10 MG capsule Take 10 mg by mouth 3 (three) times daily as needed for cramping. (Patient not taking: Reported on 09/17/2024)   [DISCONTINUED] FLUoxetine  (PROZAC ) 40 MG capsule Take 40 mg by mouth daily.  (Patient not taking: Reported on 09/17/2024)   No facility-administered medications prior to visit.   No Known Allergies  Reviews of Systems as noted in HPI.       Objective    BP 117/82 (BP Location: Left Arm, Patient Position: Sitting, Cuff Size: Normal)   Pulse 77   Temp 97.8 F (36.6 C) (Oral)   Ht 5' (1.524 m)   Wt 139 lb 6.4 oz (63.2 kg)   SpO2 98%   BMI 27.22 kg/m      Physical Exam Constitutional:      Appearance: Normal appearance.  HENT:     Head: Normocephalic and atraumatic.     Mouth/Throat:     Mouth: Mucous membranes are moist.  Eyes:     Pupils: Pupils are equal, round, and reactive to light.  Cardiovascular:     Rate and  Rhythm: Normal rate and regular rhythm.     Heart sounds: Normal heart sounds.  Pulmonary:     Effort: Pulmonary effort is normal.     Breath sounds: Normal breath sounds.  Musculoskeletal:     Right hand: Deformity present.     Left hand: Deformity present.     Comments: Heberden's and Bouchard's nodes present in bilateral hands. DIP joint tenderness present bilaterally.  R heel pain present along plantar fascia  Skin:    General: Skin is warm.  Neurological:     General: No focal deficit present.     Mental Status: She is alert.     Depression Screen    09/17/2024    9:50 AM  PHQ 2/9 Scores  PHQ - 2 Score 3  PHQ- 9 Score 17   No results found for any visits on 09/17/24.  Assessment & Plan      Problem List Items Addressed This Visit       Respiratory   Chronic obstructive pulmonary disease (HCC) - Primary   Relevant Medications   albuterol  (VENTOLIN  HFA) 108 (90 Base) MCG/ACT inhaler   Tiotropium Bromide (SPIRIVA RESPIMAT) 2.5 MCG/ACT AERS     Digestive   Crohn's disease of both small and large intestine without complication (HCC)   Relevant Orders   Ambulatory referral to Gastroenterology     Musculoskeletal and Integument   Plantar fasciitis of right foot   Arthritis of both hands   Relevant  Medications   diclofenac Sodium (VOLTAREN ARTHRITIS PAIN) 1 % GEL   Other Relevant Orders   DG Hand Complete Right   DG Hand Complete Left     Other   Herpes simplex infection   Relevant Medications   acyclovir  (ZOVIRAX ) 400 MG tablet   History of syphilis   Other Visit Diagnoses       Encounter for screening mammogram for malignant neoplasm of breast       Relevant Orders   MM 3D SCREENING MAMMOGRAM BILATERAL BREAST     Need for pneumococcal vaccination       Relevant Orders   Pneumococcal conjugate vaccine 20-valent (Prevnar 20) (Completed)     Need for influenza vaccination       Relevant Orders   Flu vaccine trivalent PF, 6mos and older(Flulaval,Afluria,Fluarix,Fluzone) (Completed)     Need for hepatitis B vaccination       Relevant Orders   Heplisav-B (HepB-CPG) Vaccine (Completed)     Need for shingles vaccine       Relevant Orders   Zoster Recombinant (Shingrix ) (Completed)     Screening for STD (sexually transmitted disease)       Relevant Orders   HIV Antibody (routine testing w rflx)   RPR     Screening for diabetes mellitus (DM)       Relevant Orders   Hemoglobin A1c     Screening for cardiovascular condition       Relevant Orders   Comprehensive metabolic panel with GFR   Lipid panel      Assessment & Plan Arthritis of hands, bilateral Chronic, uncontrolled. Patient with DIP joint pain and deformity of both hands with Heberden's and Bouchard's nodes present. Most likely due to OA. - Ordered hand x-rays to confirm OA diagnosis. - Recommended Tylenol  for arthritis. - Prescribed Voltaren gel for topical use  Plantar fasciitis, right foot Chronic, uncontrolled. Heel pain consistent with plantar fasciitis. - Recommended heel cushions for extra cushioning. - Advised use of ice, tylenol  PRN -  Recommend gentle stretching, exercises provided - Suggested Voltaren gel for topical use  Crohn's disease of both small and large intestine without complication  (HCC) Chronic, controlled. No recent flare-ups since 2020 surgery. Occasional bloating with certain foods or stress. - Referred to [facility] to re-establish care  Herpes simplex virus Chronic, controlled. Managed with daily acyclovir . Recent flare-up resolved without intervention. - Refilled acyclovir  prescription.  Chronic obstructive pulmonary disease (COPD) (HCC) Chronic, controlled. Has not been using inhalers because ran out >1 year ago. Patient smokes cigarettes and vapes daily. Wheezing noted on exam. - Refilled albuterol  inhaler. - Refilled Spiriva for daily maintenance inhaler - Encouraged smoking cessation and reduction of vaping.  History of Syphilis Infection Patient with + RPR 1:1024 in March 2024 s/p treatment. In October 2024, + RPR 1:256 s/p treatment. In April 2024, patient with known contact with syphilis and received treatment again. Found to have RPR 1:16. No further treatment given. Patient has not been sexually active since last treatment. - Will test RPR today to ensure resolution - Will test for HIV  General Health Maintenance Routine health maintenance discussed. Due for Pap smear and mammogram. - Administered vaccines today - Ordered routine lab work including kidney function, liver function, blood sugar, and cholesterol. - Scheduled Pap smear and mammogram.     Return in about 4 weeks (around 10/15/2024) for Follow Up.      Isaiah DELENA Pepper, MD  [facility] [phone] (phone) [phone] (fax)

## 2024-09-19 LAB — LIPID PANEL
Chol/HDL Ratio: 2.2 ratio (ref 0.0–4.4)
Cholesterol, Total: 160 mg/dL (ref 100–199)
HDL: 72 mg/dL (ref >39)
LDL Chol Calc (NIH): 69 mg/dL (ref 0–99)
Triglycerides: 104 mg/dL (ref 0–149)
VLDL Cholesterol Cal: 19 mg/dL (ref 5–40)

## 2024-09-19 LAB — COMPREHENSIVE METABOLIC PANEL WITH GFR
ALT: 17 IU/L (ref 0–32)
AST: 22 IU/L (ref 0–40)
Albumin: 4.1 g/dL (ref 3.8–4.9)
Alkaline Phosphatase: 130 IU/L (ref 49–135)
BUN/Creatinine Ratio: 17 (ref 9–23)
BUN: 11 mg/dL (ref 6–24)
Bilirubin Total: 0.3 mg/dL (ref 0.0–1.2)
CO2: 21 mmol/L (ref 20–29)
Calcium: 9.5 mg/dL (ref 8.7–10.2)
Chloride: 103 mmol/L (ref 96–106)
Creatinine, Ser: 0.64 mg/dL (ref 0.57–1.00)
Globulin, Total: 2.7 g/dL (ref 1.5–4.5)
Glucose: 87 mg/dL (ref 70–99)
Potassium: 4.3 mmol/L (ref 3.5–5.2)
Sodium: 140 mmol/L (ref 134–144)
Total Protein: 6.8 g/dL (ref 6.0–8.5)
eGFR: 102 mL/min/1.73 (ref >59)

## 2024-09-19 LAB — HEMOGLOBIN A1C
Est. average glucose Bld gHb Est-mCnc: 105 mg/dL
Hgb A1c MFr Bld: 5.3 % (ref 4.8–5.6)

## 2024-09-19 LAB — RPR, QUANT+TP ABS (REFLEX)
Rapid Plasma Reagin, Quant: 1:4 {titer} — ABNORMAL HIGH
T Pallidum Abs: REACTIVE — AB

## 2024-09-19 LAB — HIV ANTIBODY (ROUTINE TESTING W REFLEX): HIV Screen 4th Generation wRfx: NONREACTIVE

## 2024-09-19 LAB — RPR: RPR Ser Ql: REACTIVE — AB

## 2024-09-20 DIAGNOSIS — F411 Generalized anxiety disorder: Secondary | ICD-10-CM | POA: Diagnosis not present

## 2024-09-21 ENCOUNTER — Ambulatory Visit: Payer: Self-pay

## 2024-09-27 DIAGNOSIS — F411 Generalized anxiety disorder: Secondary | ICD-10-CM | POA: Diagnosis not present

## 2024-10-04 DIAGNOSIS — F411 Generalized anxiety disorder: Secondary | ICD-10-CM | POA: Diagnosis not present

## 2024-10-11 DIAGNOSIS — F411 Generalized anxiety disorder: Secondary | ICD-10-CM | POA: Diagnosis not present

## 2024-10-15 ENCOUNTER — Ambulatory Visit

## 2024-10-16 DIAGNOSIS — Z419 Encounter for procedure for purposes other than remedying health state, unspecified: Secondary | ICD-10-CM | POA: Diagnosis not present

## 2024-10-18 DIAGNOSIS — F411 Generalized anxiety disorder: Secondary | ICD-10-CM | POA: Diagnosis not present

## 2024-10-25 DIAGNOSIS — F411 Generalized anxiety disorder: Secondary | ICD-10-CM | POA: Diagnosis not present

## 2024-11-12 ENCOUNTER — Ambulatory Visit

## 2024-11-26 ENCOUNTER — Ambulatory Visit
